# Patient Record
Sex: Female | Born: 1935 | Race: White | Hispanic: No | Marital: Married | Smoking: Never smoker
Health system: Southern US, Community
[De-identification: ages and names within clinical notes are randomized; demographics above are authoritative.]

## PROBLEM LIST (undated history)

## (undated) DIAGNOSIS — C439 Malignant melanoma of skin, unspecified: Secondary | ICD-10-CM

## (undated) DIAGNOSIS — K219 Gastro-esophageal reflux disease without esophagitis: Secondary | ICD-10-CM

## (undated) DIAGNOSIS — N189 Chronic kidney disease, unspecified: Secondary | ICD-10-CM

## (undated) DIAGNOSIS — M810 Age-related osteoporosis without current pathological fracture: Secondary | ICD-10-CM

## (undated) DIAGNOSIS — C50919 Malignant neoplasm of unspecified site of unspecified female breast: Secondary | ICD-10-CM

## (undated) DIAGNOSIS — M545 Low back pain, unspecified: Secondary | ICD-10-CM

## (undated) DIAGNOSIS — F0390 Unspecified dementia without behavioral disturbance: Secondary | ICD-10-CM

## (undated) DIAGNOSIS — M419 Scoliosis, unspecified: Secondary | ICD-10-CM

## (undated) DIAGNOSIS — R413 Other amnesia: Secondary | ICD-10-CM

## (undated) DIAGNOSIS — C801 Malignant (primary) neoplasm, unspecified: Secondary | ICD-10-CM

## (undated) DIAGNOSIS — H9190 Unspecified hearing loss, unspecified ear: Secondary | ICD-10-CM

## (undated) HISTORY — PX: BREAST SURGERY: SHX581

## (undated) HISTORY — PX: MELANOMA EXCISION: SHX5266

---

## 1988-01-16 HISTORY — PX: MASTECTOMY: SHX3

## 1998-08-16 ENCOUNTER — Other Ambulatory Visit: Admission: RE | Admit: 1998-08-16 | Discharge: 1998-08-16 | Payer: Self-pay | Admitting: *Deleted

## 2001-11-05 ENCOUNTER — Other Ambulatory Visit: Admission: RE | Admit: 2001-11-05 | Discharge: 2001-11-05 | Payer: Self-pay | Admitting: Geriatric Medicine

## 2002-01-16 ENCOUNTER — Ambulatory Visit (HOSPITAL_BASED_OUTPATIENT_CLINIC_OR_DEPARTMENT_OTHER): Admission: RE | Admit: 2002-01-16 | Discharge: 2002-01-16 | Payer: Self-pay | Admitting: General Surgery

## 2002-02-06 ENCOUNTER — Ambulatory Visit (HOSPITAL_COMMUNITY): Admission: RE | Admit: 2002-02-06 | Discharge: 2002-02-06 | Payer: Self-pay | Admitting: Gastroenterology

## 2003-11-10 ENCOUNTER — Other Ambulatory Visit: Admission: RE | Admit: 2003-11-10 | Discharge: 2003-11-10 | Payer: Self-pay | Admitting: Geriatric Medicine

## 2005-11-26 ENCOUNTER — Encounter: Admission: RE | Admit: 2005-11-26 | Discharge: 2005-11-26 | Payer: Self-pay | Admitting: Geriatric Medicine

## 2009-01-15 HISTORY — PX: CATARACT EXTRACTION: SUR2

## 2013-02-18 ENCOUNTER — Ambulatory Visit
Admission: RE | Admit: 2013-02-18 | Discharge: 2013-02-18 | Disposition: A | Discharge status: Home / Self Care | Payer: Medicare Other | Source: Ambulatory Visit | Attending: Geriatric Medicine | Admitting: Geriatric Medicine

## 2013-02-18 ENCOUNTER — Other Ambulatory Visit: Payer: Self-pay | Admitting: Geriatric Medicine

## 2013-02-18 DIAGNOSIS — M549 Dorsalgia, unspecified: Secondary | ICD-10-CM

## 2013-11-06 ENCOUNTER — Emergency Department (HOSPITAL_COMMUNITY)
Admission: EM | Admit: 2013-11-06 | Discharge: 2013-11-06 | Disposition: A | Discharge status: Home / Self Care | Payer: Medicare Other | Attending: Emergency Medicine | Admitting: Emergency Medicine

## 2013-11-06 ENCOUNTER — Encounter (HOSPITAL_COMMUNITY): Payer: Self-pay | Admitting: Emergency Medicine

## 2013-11-06 ENCOUNTER — Emergency Department (HOSPITAL_COMMUNITY): Payer: Medicare Other

## 2013-11-06 DIAGNOSIS — R4182 Altered mental status, unspecified: Secondary | ICD-10-CM | POA: Diagnosis not present

## 2013-11-06 DIAGNOSIS — Z79899 Other long term (current) drug therapy: Secondary | ICD-10-CM | POA: Insufficient documentation

## 2013-11-06 DIAGNOSIS — S0101XA Laceration without foreign body of scalp, initial encounter: Secondary | ICD-10-CM | POA: Insufficient documentation

## 2013-11-06 DIAGNOSIS — Z853 Personal history of malignant neoplasm of breast: Secondary | ICD-10-CM | POA: Insufficient documentation

## 2013-11-06 DIAGNOSIS — Y9289 Other specified places as the place of occurrence of the external cause: Secondary | ICD-10-CM | POA: Diagnosis not present

## 2013-11-06 DIAGNOSIS — Y9389 Activity, other specified: Secondary | ICD-10-CM | POA: Insufficient documentation

## 2013-11-06 DIAGNOSIS — W19XXXA Unspecified fall, initial encounter: Secondary | ICD-10-CM

## 2013-11-06 DIAGNOSIS — W1831XA Fall on same level due to stepping on an object, initial encounter: Secondary | ICD-10-CM | POA: Diagnosis not present

## 2013-11-06 DIAGNOSIS — S0990XA Unspecified injury of head, initial encounter: Secondary | ICD-10-CM

## 2013-11-06 HISTORY — DX: Malignant neoplasm of unspecified site of unspecified female breast: C50.919

## 2013-11-06 HISTORY — DX: Malignant melanoma of skin, unspecified: C43.9

## 2013-11-06 HISTORY — DX: Malignant (primary) neoplasm, unspecified: C80.1

## 2013-11-06 NOTE — ED Notes (Signed)
Pt here from [facility]. Daughter with her and states she was putting a bottle of wine away last night and fell off a step stool. Has a laceration to the back of her head. Bleeding controlled. Daughter states she fell another time this week against her night stand.

## 2013-11-06 NOTE — Discharge Instructions (Signed)
SEEK IMMEDIATE MEDICAL ATTENTION IF: There is redness, swelling, increasing pain in the wound, or a red line that goes up your arm or leg.  Pus is coming from wound.  An unexplained temperature above 100.4 develops.  You notice a foul smell coming from the wound from beneath the Dermabond.  There is a breaking open of the wound (edged not staying together) and the Dermabond breaks open.  You have had a head injury which does not appear to require admission at this time. A concussion is a state of changed mental ability from trauma. SEEK IMMEDIATE MEDICAL ATTENTION IF: There is confusion or drowsiness (although children frequently become drowsy after injury).  You cannot awaken the injured person.  There is nausea (feeling sick to your stomach) or continued, forceful vomiting.  You notice dizziness or unsteadiness which is getting worse, or inability to walk.  You have convulsions or unconsciousness.  You experience severe, persistent headaches not relieved by Tylenol?. (Do not take aspirin as this impairs clotting abilities). Take other pain medications only as directed.  You cannot use arms or legs normally.  There are changes in pupil sizes. (This is the black center in the colored part of the eye)  There is clear or bloody discharge from the nose or ears.  Change in speech, vision, swallowing, or understanding.  Localized weakness, numbness, tingling, or change in bowel or bladder control.

## 2013-11-06 NOTE — ED Notes (Signed)
Dermabond at bedside.  

## 2013-11-06 NOTE — ED Notes (Signed)
Pt from [facility] for eval of fall last night while putting a wine bottle, pt states that she slipped and fell off a step stool. Denies any LOC, pt denies taking any blood thinners. Pt alert and oriented, no neuro deficits noted. Pt denies any pain at this time.

## 2013-11-06 NOTE — ED Provider Notes (Addendum)
CSN: [id number]     Arrival date & time 11/06/13  1259 History   First MD Initiated Contact with Patient 11/06/13 1348     Chief Complaint  Patient presents with  . Fall  . Head Laceration     (Consider location/radiation/quality/duration/timing/severity/associated sxs/prior Treatment) HPI 80 year old female with poor memory at baseline and lives at independent living has partial amnesia for the event but recalls trying to put a bottle of wine away last night and apparently was balancing on a step stool and has poor balance at baseline and fell off the stool hitting the back of her head causing a laceration to the back of her head with patient's tetanus shot up-to-date within the last couple years with no headache no neck pain no back pain no chest pain no palpitations no shortness of breath no abdominal pain no change in speech or vision or swallowing or understanding no lateralizing weakness or numbness or incoordination and patient was able to walk at baseline today with her walker prior to arrival. Patient is not on anticoagulation. Past Medical History  Diagnosis Date  . Cancer   . Breast CA   . Melanoma    Past Surgical History  Procedure Laterality Date  . Mastectomy Left   . Breast surgery      reconstructive   No family history on file. History  Substance Use Topics  . Smoking status: Never Smoker   . Smokeless tobacco: Not on file  . Alcohol Use: Not on file   OB History    No data available     Review of Systems 10 Systems reviewed and are negative for acute change except as noted in the HPI.   Allergies  Sulfa antibiotics  Home Medications   Prior to Admission medications   Medication Sig Start Date End Date Taking? Authorizing Provider  pantoprazole (PROTONIX) 40 MG tablet Take 40 mg by mouth daily.   Yes Historical Provider, MD   BP 158/67 mmHg  Pulse 99  Temp(Src) 97.5 F (36.4 C) (Oral)  Resp 21  Ht 4\' 11"  (1.499 m)  Wt 102 lb (46.267 kg)  BMI  20.59 kg/m2  SpO2 98% Physical Exam  Nursing note and vitals reviewed. Constitutional:  Awake, alert, nontoxic appearance with baseline speech for patient.  HENT:  Mouth/Throat: No oropharyngeal exudate.  Irregular occipital scalp laceration 4 cm no foreign body noted no significant deep structure involvement noted  Eyes: EOM are normal. Pupils are equal, round, and reactive to light. Right eye exhibits no discharge. Left eye exhibits no discharge.  Neck: Neck supple.  Cervical spine and back without midline tenderness or pain  Cardiovascular: Normal rate and regular rhythm.   No murmur heard. Pulmonary/Chest: Effort normal and breath sounds normal. No stridor. No respiratory distress. She has no wheezes. She has no rales. She exhibits no tenderness.  Abdominal: Soft. Bowel sounds are normal. She exhibits no mass. There is no tenderness. There is no rebound.  Musculoskeletal: She exhibits no tenderness.  Baseline ROM, moves extremities with no obvious new focal weakness.  Lymphadenopathy:    She has no cervical adenopathy.  Neurological: She is alert.  Awake, alert, cooperative and aware of situation; motor strength bilaterally; sensation normal to light touch bilaterally; peripheral visual fields full to confrontation; no facial asymmetry; tongue midline; major cranial nerves appear intact; no pronator drift, normal finger to nose bilaterally  Skin: No rash noted.  Psychiatric: She has a normal mood and affect.    ED Course  Procedures (including critical care time) LACERATION REPAIR Performed by: Babette Relic Consent: Verbal consent obtained. Risks and benefits: risks, benefits and alternatives were discussed Patient identity confirmed: provided demographic data Time out performed prior to procedure Prepped and Draped in normal sterile fashion Wound explored Laceration Location: occipital scalp Laceration Length: 4cm No Foreign Bodies seen or palpated Irrigation method:  flush water and Safclens Amount of cleaning: standard Skin closure: Dermabond Technique: Hair twist with tissue glue Patient tolerance: Patient tolerated the procedure well with no immediate complications. Labs Review Labs Reviewed - No data to display  Imaging Review No results found. Ct Head Wo Contrast  11/06/2013   CLINICAL DATA:  [place] last night.  EXAM: CT HEAD WITHOUT CONTRAST  TECHNIQUE: Contiguous axial images were obtained from the base of the skull through the vertex without intravenous contrast.  COMPARISON:  None.  FINDINGS: There is are not, symmetric hypodensity in the periventricular and subcortical white matter bilaterally consistent with chronic microvascular ischemic change. Age related cerebral volume loss with commensurate mild prominence of the cerebral ventricles. No definite hydrocephalus.  Negative for intra or extra-axial hemorrhage, mass effect, mass lesion, or evidence of acute cortically based infarction.  The skull is intact. Small polyp or mucus retention cyst seen on the inferior-most image of the left maxillary sinus, and incompletely imaged. No air-fluid levels in the visualized sinuses. Mastoid air cells are clear.  Few small lobules of subcutaneous gas noted in the posterior midline upper scalp on image number 58. No definite scalp hematoma. No radiopaque foreign body.  IMPRESSION: 1. Subcutaneous locules of gas in the posterior midline scalp suggest small scalp injury. No scalp hematoma or skull fracture. 2. No acute intracranial abnormality. 3. Advanced chronic microvascular ischemic changes.   Electronically Signed   By: Curlene Dolphin M.D.   On: 11/06/2013 16:19    EKG Interpretation None      MDM   Final diagnoses:  Head injury, acute, initial encounter  Laceration of scalp  Fall    Patient / Family / Caregiver informed of clinical course, understand medical decision-making process, and agree with plan.  I doubt any other EMC precluding discharge at  this time including, but not necessarily limited to the following:ICH, CSI.    Babette Relic, MD 11/15/13 1546  Addendum: CT head indicated because Pt over 32yo with fall with head injury and partial amnesia for event with short term memory deficit. Pt meets [place]US, [place], and [place] criteria for ordering head CT.  Babette Relic, MD 12/10/13 [phone]

## 2014-01-19 ENCOUNTER — Observation Stay (HOSPITAL_COMMUNITY)
Admission: EM | Admit: 2014-01-19 | Discharge: 2014-01-20 | Disposition: A | Discharge status: Home / Self Care | Payer: Medicare Other | Attending: Internal Medicine | Admitting: Internal Medicine

## 2014-01-19 ENCOUNTER — Emergency Department (HOSPITAL_COMMUNITY): Payer: Medicare Other

## 2014-01-19 ENCOUNTER — Encounter (HOSPITAL_COMMUNITY): Payer: Self-pay | Admitting: Family Medicine

## 2014-01-19 ENCOUNTER — Observation Stay (HOSPITAL_COMMUNITY): Payer: Medicare Other

## 2014-01-19 DIAGNOSIS — R531 Weakness: Secondary | ICD-10-CM | POA: Diagnosis present

## 2014-01-19 DIAGNOSIS — Z7982 Long term (current) use of aspirin: Secondary | ICD-10-CM | POA: Diagnosis not present

## 2014-01-19 DIAGNOSIS — M545 Low back pain: Secondary | ICD-10-CM | POA: Diagnosis not present

## 2014-01-19 DIAGNOSIS — Z853 Personal history of malignant neoplasm of breast: Secondary | ICD-10-CM | POA: Diagnosis not present

## 2014-01-19 DIAGNOSIS — M810 Age-related osteoporosis without current pathological fracture: Secondary | ICD-10-CM | POA: Insufficient documentation

## 2014-01-19 DIAGNOSIS — N189 Chronic kidney disease, unspecified: Secondary | ICD-10-CM | POA: Insufficient documentation

## 2014-01-19 DIAGNOSIS — I517 Cardiomegaly: Secondary | ICD-10-CM

## 2014-01-19 DIAGNOSIS — K219 Gastro-esophageal reflux disease without esophagitis: Secondary | ICD-10-CM | POA: Diagnosis not present

## 2014-01-19 DIAGNOSIS — R42 Dizziness and giddiness: Secondary | ICD-10-CM

## 2014-01-19 DIAGNOSIS — I639 Cerebral infarction, unspecified: Secondary | ICD-10-CM

## 2014-01-19 DIAGNOSIS — E43 Unspecified severe protein-calorie malnutrition: Secondary | ICD-10-CM | POA: Diagnosis present

## 2014-01-19 DIAGNOSIS — G588 Other specified mononeuropathies: Secondary | ICD-10-CM | POA: Diagnosis not present

## 2014-01-19 DIAGNOSIS — G459 Transient cerebral ischemic attack, unspecified: Principal | ICD-10-CM | POA: Diagnosis present

## 2014-01-19 DIAGNOSIS — F0390 Unspecified dementia without behavioral disturbance: Secondary | ICD-10-CM | POA: Insufficient documentation

## 2014-01-19 DIAGNOSIS — M419 Scoliosis, unspecified: Secondary | ICD-10-CM | POA: Diagnosis not present

## 2014-01-19 DIAGNOSIS — I6529 Occlusion and stenosis of unspecified carotid artery: Secondary | ICD-10-CM | POA: Diagnosis not present

## 2014-01-19 DIAGNOSIS — W19XXXA Unspecified fall, initial encounter: Secondary | ICD-10-CM

## 2014-01-19 DIAGNOSIS — Z85820 Personal history of malignant melanoma of skin: Secondary | ICD-10-CM | POA: Insufficient documentation

## 2014-01-19 DIAGNOSIS — G458 Other transient cerebral ischemic attacks and related syndromes: Secondary | ICD-10-CM

## 2014-01-19 HISTORY — DX: Chronic kidney disease, unspecified: N18.9

## 2014-01-19 HISTORY — DX: Gastro-esophageal reflux disease without esophagitis: K21.9

## 2014-01-19 HISTORY — DX: Low back pain, unspecified: M54.50

## 2014-01-19 HISTORY — DX: Other amnesia: R41.3

## 2014-01-19 HISTORY — DX: Scoliosis, unspecified: M41.9

## 2014-01-19 HISTORY — DX: Age-related osteoporosis without current pathological fracture: M81.0

## 2014-01-19 HISTORY — DX: Low back pain: M54.5

## 2014-01-19 LAB — COMPREHENSIVE METABOLIC PANEL
ALK PHOS: 99 U/L (ref 39–117)
ALT: 35 U/L (ref 0–35)
AST: 45 U/L — AB (ref 0–37)
Albumin: 3.7 g/dL (ref 3.5–5.2)
Anion gap: 8 (ref 5–15)
BUN: 22 mg/dL (ref 6–23)
CO2: 26 mmol/L (ref 19–32)
Calcium: 9.3 mg/dL (ref 8.4–10.5)
Chloride: 101 mEq/L (ref 96–112)
Creatinine, Ser: 1.22 mg/dL — ABNORMAL HIGH (ref 0.50–1.10)
GFR calc non Af Amer: 39 mL/min — ABNORMAL LOW (ref >90)
GFR, EST AFRICAN AMERICAN: 45 mL/min — AB (ref >90)
GLUCOSE: 173 mg/dL — AB (ref 70–99)
Potassium: 3.6 mmol/L (ref 3.5–5.1)
Sodium: 135 mmol/L (ref 135–145)
TOTAL PROTEIN: 7 g/dL (ref 6.0–8.3)
Total Bilirubin: 0.6 mg/dL (ref 0.3–1.2)

## 2014-01-19 LAB — CBC
HEMATOCRIT: 38.5 % (ref 36.0–46.0)
Hemoglobin: 12.6 g/dL (ref 12.0–15.0)
MCH: 29.8 pg (ref 26.0–34.0)
MCHC: 32.7 g/dL (ref 30.0–36.0)
MCV: 91 fL (ref 78.0–100.0)
PLATELETS: 364 10*3/uL (ref 150–400)
RBC: 4.23 MIL/uL (ref 3.87–5.11)
RDW: 13.1 % (ref 11.5–15.5)
WBC: 13.4 10*3/uL — ABNORMAL HIGH (ref 4.0–10.5)

## 2014-01-19 LAB — POCT I-STAT, CHEM 8
BUN: 22 mg/dL (ref 6–23)
CALCIUM ION: 1.22 mmol/L (ref 1.13–1.30)
CREATININE: 1.3 mg/dL — AB (ref 0.50–1.10)
Chloride: 100 mEq/L (ref 96–112)
GLUCOSE: 176 mg/dL — AB (ref 70–99)
HEMATOCRIT: 40 % (ref 36.0–46.0)
Hemoglobin: 13.6 g/dL (ref 12.0–15.0)
Potassium: 3.8 mmol/L (ref 3.5–5.1)
Sodium: 137 mmol/L (ref 135–145)
TCO2: 23 mmol/L (ref 0–100)

## 2014-01-19 LAB — DIFFERENTIAL
Basophils Absolute: 0 10*3/uL (ref 0.0–0.1)
Basophils Relative: 0 % (ref 0–1)
EOS ABS: 0 10*3/uL (ref 0.0–0.7)
Eosinophils Relative: 0 % (ref 0–5)
LYMPHS ABS: 0.6 10*3/uL — AB (ref 0.7–4.0)
LYMPHS PCT: 5 % — AB (ref 12–46)
Monocytes Absolute: 1 10*3/uL (ref 0.1–1.0)
Monocytes Relative: 7 % (ref 3–12)
Neutro Abs: 11.8 10*3/uL — ABNORMAL HIGH (ref 1.7–7.7)
Neutrophils Relative %: 88 % — ABNORMAL HIGH (ref 43–77)

## 2014-01-19 LAB — HEMOGLOBIN A1C
Hgb A1c MFr Bld: 5.8 % — ABNORMAL HIGH (ref <5.7)
MEAN PLASMA GLUCOSE: 120 mg/dL — AB (ref <117)

## 2014-01-19 LAB — LIPID PANEL
CHOL/HDL RATIO: 4 ratio
Cholesterol: 223 mg/dL — ABNORMAL HIGH (ref 0–200)
HDL: 56 mg/dL (ref >39)
LDL CALC: 153 mg/dL — AB (ref 0–99)
TRIGLYCERIDES: 72 mg/dL (ref <150)
VLDL: 14 mg/dL (ref 0–40)

## 2014-01-19 LAB — ETHANOL

## 2014-01-19 LAB — PROTIME-INR
INR: 1.1 (ref 0.00–1.49)
PROTHROMBIN TIME: 14.4 s (ref 11.6–15.2)

## 2014-01-19 LAB — APTT: aPTT: 35 seconds (ref 24–37)

## 2014-01-19 LAB — POCT I-STAT TROPONIN I: Troponin i, poc: 0 ng/mL (ref 0.00–0.08)

## 2014-01-19 MED ORDER — ENOXAPARIN SODIUM 30 MG/0.3ML ~~LOC~~ SOLN
30.0000 mg | SUBCUTANEOUS | Status: DC
  Administered 2014-01-19 – 2014-01-20 (×2): 30 mg via SUBCUTANEOUS
  Filled 2014-01-19 (×4): qty 0.3

## 2014-01-19 MED ORDER — SENNOSIDES-DOCUSATE SODIUM 8.6-50 MG PO TABS: 1.0000 | ORAL_TABLET | Freq: Every evening | ORAL | Status: DC | PRN

## 2014-01-19 MED ORDER — PANTOPRAZOLE SODIUM 40 MG PO TBEC
40.0000 mg | DELAYED_RELEASE_TABLET | Freq: Every day | ORAL | Status: DC
  Administered 2014-01-19 – 2014-01-20 (×2): 40 mg via ORAL
  Filled 2014-01-19 (×2): qty 1

## 2014-01-19 MED ORDER — ASPIRIN EC 81 MG PO TBEC
81.0000 mg | DELAYED_RELEASE_TABLET | Freq: Every day | ORAL | Status: DC
  Administered 2014-01-19 – 2014-01-20 (×2): 81 mg via ORAL
  Filled 2014-01-19 (×2): qty 1

## 2014-01-19 MED ORDER — STROKE: EARLY STAGES OF RECOVERY BOOK
Freq: Once | Status: DC
Start: 2014-01-19 — End: 2014-01-19
  Filled 2014-01-19 (×2): qty 1

## 2014-01-19 NOTE — Progress Notes (Signed)
PT Cancellation Note  Patient Details Name: KATERI BALCH MRN: [id number] DOB: 03-12-1924   Cancelled Treatment:    Reason Eval/Treat Not Completed: Other (comment).  Orders to start 1/6,  Will check back tomorrow.    Denice Bors 01/19/2014, 12:46 PM

## 2014-01-19 NOTE — Progress Notes (Signed)
*  PRELIMINARY RESULTS* Vascular Ultrasound Carotid Duplex (Doppler) has been completed.  Findings suggest 1-39% internal carotid artery stenosis bilaterally. Vertebral arteries are patent with antegrade flow.  01/19/2014 2:11 PM Maudry Mayhew, RVT, RDCS, RDMS

## 2014-01-19 NOTE — ED Notes (Signed)
Pt in CT.

## 2014-01-19 NOTE — Evaluation (Signed)
{  EVAL NOTESLP Cancellation Note  Patient Details Name: Tanya Leblanc MRN: [id number] DOB: [birth year]   Cancelled treatment:       Reason Eval/Treat Not Completed:  (order for SLE to start 1/6 received, pt passed RNSSS)   Macario Golds 01/19/2014, 11:07 AM   Luanna Salk, [facility] SLP [phone]

## 2014-01-19 NOTE — H&P (Addendum)
Hospitalist Admission History and Physical  Patient name: Tanya Leblanc Medical record number: [id number] Date of birth: 03-08-1924 Age: 81 y.o. Gender: female  Primary Care Provider: Mathews Argyle, MD  Chief Complaint: ? TIA, fall, dizziness   History of Present Illness:This is a 81 y.o. year old female with no significant past medical history presenting with ? TIA, fall. Pt resident of local ALF. Pt was reported to have fallen while in closet. Pt was down for approx 45 mins. Pt was laying on her R shoulder/arm. Pt was helped up w/ noted R arm weakness. No reported LOC or head trauma. Pt does report feeling dizzy around time of fall.  T 97.9, HR 90s, resp 10s, BP 150s-170s, satting 97% on RA. WBC 13.4, hgb 12.6, Cr 1.22. Glu 173. CXR WNL. Head CT shows chronic ischemic changes. Pt/daughter deny any prior hx/o CVA in the past. Urinalysis pending.   Assessment and Plan: Tanya Leblanc is a 81 y.o. year old female presenting with weakness, TIA.   Active Problems:   Weakness   TIA (transient ischemic attack)   1- TIA  -asymptomatic on exam  -will proceed down TIA pathway including MRI, MRA, 2D ECHO, risk stratification labs  -tele bed -neuro c/s in am  -hold ASA pending imaging   FEN/GI: heart healthy diet pending bedside swallow eval  Prophylaxis: lovenox  Disposition: pending further evaluation  Code Status:Full Code    Patient Active Problem List   Diagnosis Date Noted  . Weakness 01/19/2014  . TIA (transient ischemic attack) 01/19/2014   Past Medical History: Past Medical History  Diagnosis Date  . Scoliosis   . Low back pain   . Chronic kidney disease   . Amnesia   . Cancer   . Breast CA   . Melanoma   . GERD (gastroesophageal reflux disease)   . Osteoporosis     Past Surgical History: Past Surgical History  Procedure Laterality Date  . Mastectomy Left   . Breast surgery      reconstructive  . Cataract extraction Bilateral      Social History: History   Social History  . Marital Status: Married    Spouse Name: N/A    Number of Children: N/A  . Years of Education: N/A   Social History Main Topics  . Smoking status: Never Smoker   . Smokeless tobacco: None  . Alcohol Use: Yes     Comment: 0.5 Glass a day  . Drug Use: No  . Sexual Activity: None   Other Topics Concern  . None   Social History Narrative    Family History: History reviewed. No pertinent family history.  Allergies: Allergies  Allergen Reactions  . Actonel [Risedronate] Other (See Comments)    Per patients mar  . Boniva [Ibandronic Acid] Other (See Comments)    Per patiens mar  . Fosamax [Alendronate] Other (See Comments)    Per patiens mar  . Miacalcin [Calcitonin] Other (See Comments)    Per patients mar  . Sulfa Antibiotics Itching    Current Facility-Administered Medications  Medication Dose Route Frequency Provider Last Rate Last Dose  .  stroke: mapping our early stages of recovery book   Does not apply Once Shanda Howells, MD      . enoxaparin (LOVENOX) injection 30 mg  30 mg Subcutaneous Q24H Shanda Howells, MD      . pantoprazole (PROTONIX) EC tablet 40 mg  40 mg Oral Daily Shanda Howells, MD      .  senna-docusate (Senokot-S) tablet 1 tablet  1 tablet Oral QHS PRN Shanda Howells, MD       Current Outpatient Prescriptions  Medication Sig Dispense Refill  . acetaminophen (TYLENOL) 500 MG tablet Take 500 mg by mouth every 6 (six) hours as needed for mild pain.    . pantoprazole (PROTONIX) 40 MG tablet Take 40 mg by mouth daily.    . ranitidine (ZANTAC) 150 MG tablet Take 150 mg by mouth daily as needed for heartburn.     Review Of Systems: 12 point ROS negative except as noted above in HPI.  Physical Exam: Filed Vitals:   01/19/14 0400  BP: 157/69  Pulse:   Temp:   Resp:     General: alert and cooperative HEENT: PERRLA and extra ocular movement intact Heart: S1, S2 normal, no murmur, rub or gallop, regular  rate and rhythm Lungs: clear to auscultation, no wheezes or rales and unlabored breathing Abdomen: abdomen is soft without significant tenderness, masses, organomegaly or guarding Extremities: extremities normal, atraumatic, no cyanosis or edema Skin:no rashes Neurology: normal without focal findings  Labs and Imaging: Lab Results  Component Value Date/Time   NA 135 01/19/2014 02:44 AM   K 3.6 01/19/2014 02:44 AM   CL 101 01/19/2014 02:44 AM   CO2 26 01/19/2014 02:44 AM   BUN 22 01/19/2014 02:44 AM   CREATININE 1.22* 01/19/2014 02:44 AM   GLUCOSE 173* 01/19/2014 02:44 AM   Lab Results  Component Value Date   WBC 13.4* 01/19/2014   HGB 12.6 01/19/2014   HCT 38.5 01/19/2014   MCV 91.0 01/19/2014   PLT 364 01/19/2014   Urinalysis No results found for: COLORURINE, APPEARANCEUR, LABSPEC, PHURINE, GLUCOSEU, HGBUR, BILIRUBINUR, KETONESUR, PROTEINUR, UROBILINOGEN, NITRITE, LEUKOCYTESUR     Dg Chest 2 View  01/19/2014   CLINICAL DATA:  Patient became dizzy this morning and fell. Right arm pain.  EXAM: CHEST  2 VIEW  COMPARISON:  None.  FINDINGS: Shallow inspiration. Cardiac enlargement. Normal pulmonary vascularity. Emphysematous changes in the upper lungs with interstitial fibrosis in the lung bases. No focal consolidation. No blunting of costophrenic angles. No pneumothorax. Surgical clips in the left axilla.  IMPRESSION: No active cardiopulmonary disease.   Electronically Signed   By: Lucienne Capers M.D.   On: 01/19/2014 02:46   Ct Head Wo Contrast  01/19/2014   CLINICAL DATA:  Patient became dizzy and fell. Generalize weakness. Numbness to the right arm. History of melanoma and left breast cancer. Patient will not straighten her head.  EXAM: CT HEAD WITHOUT CONTRAST  TECHNIQUE: Contiguous axial images were obtained from the base of the skull through the vertex without intravenous contrast.  COMPARISON:  11/06/2013  FINDINGS: Examination is limited due to patient positioning. Diffuse  cerebral atrophy. Ventricular dilatation likely due to central atrophy. Low-attenuation throughout the deep and periventricular white matter consistent with small vessel ischemia. No mass effect or midline shift. No abnormal extra-axial fluid collections. Gray-white matter junctions are distinct. Basal cisterns are not effaced. No evidence of acute intracranial hemorrhage. No depressed skull fractures. Small retention cysts in the maxillary antra. Mastoid air cells are not opacified. Vascular calcifications.  IMPRESSION: No acute intracranial abnormalities. Chronic atrophy and small vessel ischemic changes.   Electronically Signed   By: Lucienne Capers M.D.   On: 01/19/2014 02:42           Shanda Howells MD  Pager: [phone]

## 2014-01-19 NOTE — Progress Notes (Signed)
  Echocardiogram 2D Echocardiogram has been performed.  Donata Clay 01/19/2014, 12:34 PM

## 2014-01-19 NOTE — ED Notes (Signed)
Attempted an in and out cath. Received urine in the tube but not enough for a sample. Will inform Dr. Claudine Mouton. Vallarie Mare, NT assisted. Pt has been urinating cause pull up with soaked. Performed patient hygiene on patient. Changed pull up.

## 2014-01-19 NOTE — ED Notes (Signed)
Per EMS, patient fell around 23:00 on to a carpeted surface from being dizzy. Denies any LOC or hitting her head on anything. Had right arm numbness for about 1 hr and now it is gone. Pt stated she laid on the arm for about 45 min - 1 hr before someone saw her. Denies pain except generalized weakness.

## 2014-01-19 NOTE — Progress Notes (Signed)
[facility] History and Physical  Tanya Leblanc QZR:[id number] DOB: 11-12-1924    PCP:   Mathews Argyle, MD   81 yo female admitted yesterday for possible TIA.  She was seen by neurology who made recommendation of TIA work up. Her MRI was negative.  Her doppler had only up to 39 percent stenosis.  She was given ASA 81mg  per day.  No further event.  Rewiew of Systems:  Constitutional: Negative for malaise, fever and chills. No significant weight loss or weight gain Eyes: Negative for eye pain, redness and discharge, diplopia, visual changes, or flashes of light. ENMT: Negative for ear pain, hoarseness, nasal congestion, sinus pressure and sore throat. No headaches; tinnitus, drooling, or problem swallowing. Cardiovascular: Negative for chest pain, palpitations, diaphoresis, dyspnea and peripheral edema. ; No orthopnea, PND Respiratory: Negative for cough, hemoptysis, wheezing and stridor. No pleuritic chestpain. Gastrointestinal: Negative for nausea, vomiting, diarrhea, constipation, abdominal pain, melena, blood in stool, hematemesis, jaundice and rectal bleeding.    Genitourinary: Negative for frequency, dysuria, incontinence,flank pain and hematuria; Musculoskeletal: Negative for back pain and neck pain. Negative for swelling and trauma.;  Skin: . Negative for pruritus, rash, abrasions, bruising and skin lesion.; ulcerations Neuro: Negative for headache, lightheadedness and neck stiffness. Negative for weakness, altered level of consciousness , altered mental status, extremity weakness, burning feet, involuntary movement, seizure and syncope.  Psych: negative for anxiety, depression, insomnia, tearfulness, panic attacks, hallucinations, paranoia, suicidal or homicidal ideation   Past Medical History  Diagnosis Date  . Scoliosis   . Low back pain   . Chronic kidney disease   . Amnesia   . Cancer   . Breast CA   . Melanoma   . GERD (gastroesophageal reflux disease)    . Osteoporosis     Past Surgical History  Procedure Laterality Date  . Mastectomy Left   . Breast surgery      reconstructive  . Cataract extraction Bilateral     Medications:  HOME MEDS: Prior to Admission medications   Medication Sig Start Date End Date Taking? Authorizing Provider  acetaminophen (TYLENOL) 500 MG tablet Take 500 mg by mouth every 6 (six) hours as needed for mild pain.   Yes Historical Provider, MD  pantoprazole (PROTONIX) 40 MG tablet Take 40 mg by mouth daily.   Yes Historical Provider, MD  ranitidine (ZANTAC) 150 MG tablet Take 150 mg by mouth daily as needed for heartburn.   Yes Historical Provider, MD     Allergies:  Allergies  Allergen Reactions  . Actonel [Risedronate] Other (See Comments)    Per patients mar  . Boniva [Ibandronic Acid] Other (See Comments)    Per patiens mar  . Fosamax [Alendronate] Other (See Comments)    Per patiens mar  . Miacalcin [Calcitonin] Other (See Comments)    Per patients mar  . Sulfa Antibiotics Itching    Social History:   reports that she has never smoked. She does not have any smokeless tobacco history on file. She reports that she drinks alcohol. She reports that she does not use illicit drugs.  Family History: History reviewed. No pertinent family history.   Physical Exam: Filed Vitals:   01/19/14 0400 01/19/14 0417 01/19/14 0510 01/19/14 1345  BP: 157/69 147/67 155/72 172/73  Pulse:  95 90 84  Temp:  98.3 F (36.8 C) 98.1 F (36.7 C) 97.8 F (36.6 C)  TempSrc:  Oral Axillary Oral  Resp:  20 20 20   Height:      Weight:  44.8 kg (98 lb 12.3 oz)   SpO2:  100% 100% 99%   Blood pressure 172/73, pulse 84, temperature 97.8 F (36.6 C), temperature source Oral, resp. rate 20, height 5\' 2"  (1.575 m), weight 44.8 kg (98 lb 12.3 oz), SpO2 99 %.  GEN:  Pleasant patient lying in the stretcher in no acute distress; She doesn't answer any question for me. PSYCH:  Confused.; does not appear anxious or  depressed; affect is appropriate. HEENT: Mucous membranes pink and anicteric; PERRLA; EOM intact; no cervical lymphadenopathy nor thyromegaly or carotid bruit; no JVD; There were no stridor. Neck is very supple. Breasts:: Not examined CHEST WALL: No tenderness CHEST: Normal respiration, clear to auscultation bilaterally.  HEART: Regular rate and rhythm.  There are no murmur, rub, or gallops.   BACK: No kyphosis or scoliosis; no CVA tenderness ABDOMEN: soft and non-tender; no masses, no organomegaly, normal abdominal bowel sounds; no pannus; no intertriginous candida. There is no rebound and no distention. Rectal Exam: Not done EXTREMITIES: No bone or joint deformity; age-appropriate arthropathy of the hands and knees; no edema; no ulcerations.  There is no calf tenderness. Genitalia: not examined PULSES: 2+ and symmetric SKIN: Normal hydration no rash or ulceration CNS: Cranial nerves 2-12 grossly intact no focal lateralizing neurologic deficit.  Speech is fluent; uvula elevated with phonation, facial symmetry and tongue midline. DTR are normal bilaterally, cerebella exam is intact, barbinski is negative and strengths are equaled bilaterally.  No sensory loss.   Labs on Admission:  Basic Metabolic Panel:  Recent Labs Lab 01/19/14 0244 01/19/14 0254  NA 135 137  K 3.6 3.8  CL 101 100  CO2 26  --   GLUCOSE 173* 176*  BUN 22 22  CREATININE 1.22* 1.30*  CALCIUM 9.3  --    Liver Function Tests:  Recent Labs Lab 01/19/14 0244  AST 45*  ALT 35  ALKPHOS 99  BILITOT 0.6  PROT 7.0  ALBUMIN 3.7   No results for input(s): LIPASE, AMYLASE in the last 168 hours. No results for input(s): AMMONIA in the last 168 hours. CBC:  Recent Labs Lab 01/19/14 0244 01/19/14 0254  WBC 13.4*  --   NEUTROABS 11.8*  --   HGB 12.6 13.6  HCT 38.5 40.0  MCV 91.0  --   PLT 364  --    Cardiac Enzymes: No results for input(s): CKTOTAL, CKMB, CKMBINDEX, TROPONINI in the last 168  hours.  CBG: No results for input(s): GLUCAP in the last 168 hours.   Radiological Exams on Admission: Dg Chest 2 View  01/19/2014   CLINICAL DATA:  Patient became dizzy this morning and fell. Right arm pain.  EXAM: CHEST  2 VIEW  COMPARISON:  None.  FINDINGS: Shallow inspiration. Cardiac enlargement. Normal pulmonary vascularity. Emphysematous changes in the upper lungs with interstitial fibrosis in the lung bases. No focal consolidation. No blunting of costophrenic angles. No pneumothorax. Surgical clips in the left axilla.  IMPRESSION: No active cardiopulmonary disease.   Electronically Signed   By: Lucienne Capers M.D.   On: 01/19/2014 02:46   Ct Head Wo Contrast  01/19/2014   CLINICAL DATA:  Patient became dizzy and fell. Generalize weakness. Numbness to the right arm. History of melanoma and left breast cancer. Patient will not straighten her head.  EXAM: CT HEAD WITHOUT CONTRAST  TECHNIQUE: Contiguous axial images were obtained from the base of the skull through the vertex without intravenous contrast.  COMPARISON:  11/06/2013  FINDINGS: Examination is limited due to  patient positioning. Diffuse cerebral atrophy. Ventricular dilatation likely due to central atrophy. Low-attenuation throughout the deep and periventricular white matter consistent with small vessel ischemia. No mass effect or midline shift. No abnormal extra-axial fluid collections. Gray-white matter junctions are distinct. Basal cisterns are not effaced. No evidence of acute intracranial hemorrhage. No depressed skull fractures. Small retention cysts in the maxillary antra. Mastoid air cells are not opacified. Vascular calcifications.  IMPRESSION: No acute intracranial abnormalities. Chronic atrophy and small vessel ischemic changes.   Electronically Signed   By: Lucienne Capers M.D.   On: 01/19/2014 02:42   Mr Brain Wo Contrast  01/19/2014   CLINICAL DATA:  81 year old female with history of breast cancer and melanoma presenting  with dizziness after fall. Generalized weakness and right arm numbness. Subsequent encounter.  EXAM: MRI HEAD WITHOUT CONTRAST  MRA HEAD WITHOUT CONTRAST  TECHNIQUE: Multiplanar, multiecho pulse sequences of the brain and surrounding structures were obtained without intravenous contrast. Angiographic images of the head were obtained using MRA technique without contrast.  COMPARISON:  01/19/2014 head CT.  No comparison brain MR.  FINDINGS: MRI HEAD FINDINGS  No acute infarct.  Significant small vessel disease type changes with confluent white matter type changes subcortical/ periventricular region.  Atrophy. Ventricular prominence probably related to atrophy although difficult to completely exclude a mild component of hydrocephalus.  No intracranial hemorrhage.  No intracranial mass lesion noted on this unenhanced exam.  Major intracranial vascular structures are patent.  Cervical spondylotic changes with spinal stenosis and cord flattening C3-4 and C4-5 level. Transverse ligament hypertrophy. If further delineation is clinically desired, MR of the cervical spine may be considered.  Minimal to mild mucosal thickening inferior maxillary sinuses and minimal mucosal thickening ethmoid sinus air cells.  Cervical medullary junction, pituitary region, pineal region and orbital structures unremarkable.  MRA HEAD FINDINGS  Mild irregularity and narrowing cavernous segment internal carotid artery bilaterally.  Mild to moderate narrowing M1 segment right middle cerebral artery.  Early branching left middle cerebral artery.  Mild middle cerebral artery branch vessel irregularity bilaterally.  Mild narrowing and irregularity A1 segment and A2 segment anterior cerebral artery bilaterally.  No significant stenosis of the distal vertebral arteries.  Moderate narrowing of portions of the posterior inferior cerebellar artery bilaterally.  Mild narrowing mid aspect of the basilar artery more notable right lateral aspect.   Nonvisualized anterior inferior cerebellar artery bilaterally.  Mild to moderate narrowing superior cerebral artery more notable on the left.  Moderate to marked tandem stenosis posterior cerebral artery greater on the right.  No aneurysm noted.  IMPRESSION: MRI HEAD  No acute infarct.  Significant small vessel disease type changes with confluent white matter type changes subcortical/ periventricular region.  Atrophy. Ventricular prominence probably related to atrophy although difficult to completely exclude a mild component of hydrocephalus.  No intracranial hemorrhage.  No intracranial mass lesion noted on this unenhanced exam.  Cervical spondylotic changes with spinal stenosis and cord flattening C3-4 and C4-5 level. If further delineation is clinically desired, MR of the cervical spine may be considered.  MRA HEAD  Prominent intracranial atherosclerotic type changes as detailed above.   Electronically Signed   By: Chauncey Cruel M.D.   On: 01/19/2014 10:20   Mr Jodene Nam Head/brain Wo Cm  01/19/2014   CLINICAL DATA:  81 year old female with history of breast cancer and melanoma presenting with dizziness after fall. Generalized weakness and right arm numbness. Subsequent encounter.  EXAM: MRI HEAD WITHOUT CONTRAST  MRA HEAD WITHOUT CONTRAST  TECHNIQUE: Multiplanar, multiecho pulse sequences of the brain and surrounding structures were obtained without intravenous contrast. Angiographic images of the head were obtained using MRA technique without contrast.  COMPARISON:  01/19/2014 head CT.  No comparison brain MR.  FINDINGS: MRI HEAD FINDINGS  No acute infarct.  Significant small vessel disease type changes with confluent white matter type changes subcortical/ periventricular region.  Atrophy. Ventricular prominence probably related to atrophy although difficult to completely exclude a mild component of hydrocephalus.  No intracranial hemorrhage.  No intracranial mass lesion noted on this unenhanced exam.  Major  intracranial vascular structures are patent.  Cervical spondylotic changes with spinal stenosis and cord flattening C3-4 and C4-5 level. Transverse ligament hypertrophy. If further delineation is clinically desired, MR of the cervical spine may be considered.  Minimal to mild mucosal thickening inferior maxillary sinuses and minimal mucosal thickening ethmoid sinus air cells.  Cervical medullary junction, pituitary region, pineal region and orbital structures unremarkable.  MRA HEAD FINDINGS  Mild irregularity and narrowing cavernous segment internal carotid artery bilaterally.  Mild to moderate narrowing M1 segment right middle cerebral artery.  Early branching left middle cerebral artery.  Mild middle cerebral artery branch vessel irregularity bilaterally.  Mild narrowing and irregularity A1 segment and A2 segment anterior cerebral artery bilaterally.  No significant stenosis of the distal vertebral arteries.  Moderate narrowing of portions of the posterior inferior cerebellar artery bilaterally.  Mild narrowing mid aspect of the basilar artery more notable right lateral aspect.  Nonvisualized anterior inferior cerebellar artery bilaterally.  Mild to moderate narrowing superior cerebral artery more notable on the left.  Moderate to marked tandem stenosis posterior cerebral artery greater on the right.  No aneurysm noted.  IMPRESSION: MRI HEAD  No acute infarct.  Significant small vessel disease type changes with confluent white matter type changes subcortical/ periventricular region.  Atrophy. Ventricular prominence probably related to atrophy although difficult to completely exclude a mild component of hydrocephalus.  No intracranial hemorrhage.  No intracranial mass lesion noted on this unenhanced exam.  Cervical spondylotic changes with spinal stenosis and cord flattening C3-4 and C4-5 level. If further delineation is clinically desired, MR of the cervical spine may be considered.  MRA HEAD  Prominent  intracranial atherosclerotic type changes as detailed above.   Electronically Signed   By: Chauncey Cruel M.D.   On: 01/19/2014 10:20   Assessment/Plan Present on Admission:  . TIA (transient ischemic attack)   PLAN: Will continue with TIA work up.  Physical therapy to be started tomorrow.  Continue with ASA.  Check Homocysteine level and Lipid.  Neuro is following.  Other plans as per orders.  Code Status: FULL Haskel Khan, MD. [facility] Pager [phone] 7pm to 7am.  01/19/2014, 2:22 PM

## 2014-01-19 NOTE — Consult Note (Signed)
Neurology Consultation Reason for Consult: Right arm weakness and numbness Referring Physician: Susanne Borders  CC: right arm weakness and numbness  History is obtained from:Patient  HPI: Tanya Leblanc is a 81 y.o. female who was in her normal state yesterday when she became "dizzy" by which she means lightheaded. She fell and was unable to get up. She was helped up and it was noted that she had right arm weakness. All of her symptoms resolved over the course of 45 minutes.   ROS: A 14 point ROS was performed and is negative except as noted in the HPI.   Past Medical History  Diagnosis Date  . Scoliosis   . Low back pain   . Chronic kidney disease   . Amnesia   . Cancer   . Breast CA   . Melanoma   . GERD (gastroesophageal reflux disease)   . Osteoporosis     Family History: No hx similar  Social History: Tob: denies  Exam: Current vital signs: BP 155/72 mmHg  Pulse 90  Temp(Src) 98.1 F (36.7 C) (Axillary)  Resp 20  Ht 5\' 2"  (1.575 m)  Wt 44.8 kg (98 lb 12.3 oz)  BMI 18.06 kg/m2  SpO2 100% Vital signs in last 24 hours: Temp:  [97.9 F (36.6 C)-98.3 F (36.8 C)] 98.1 F (36.7 C) (01/05 0510) Pulse Rate:  [90-95] 90 (01/05 0510) Resp:  [17-21] 20 (01/05 0510) BP: (147-170)/(67-76) 155/72 mmHg (01/05 0510) SpO2:  [94 %-100 %] 100 % (01/05 0510) Weight:  [44.8 kg (98 lb 12.3 oz)-45.36 kg (100 lb)] 44.8 kg (98 lb 12.3 oz) (01/05 0510)   Physical Exam  Constitutional: Appears well-developed and well-nourished.  Psych: Affect appropriate to situation Eyes: No scleral injection HENT: No OP obstrucion Head: Normocephalic.  Cardiovascular: Normal rate and regular rhythm.  Respiratory: Effort normal  GI: Soft.  No distension. There is no tenderness.  Skin: WDI  Neuro: Mental Status: Patient is awake, alert, oriented to person, place, She initially gives year as 2013, but self corrects to 2016.  Patient is able to give a clear and coherent history. No signs of  aphasia or neglect Cranial Nerves: II: Visual Fields are full. Pupils are equal, round, and reactive to light.   III,IV, VI: EOMI without ptosis or diploplia.  V: Facial sensation is symmetric to temperature VII: Facial movement is symmetric.  VIII: hard of hearing X: Uvula elevates symmetrically XI: Shoulder shrug is symmetric. XII: tongue is midline without atrophy or fasciculations.  Motor: Tone is normal. Bulk is normal. 5/5 strength was present in all four extremities.  Sensory: Sensation is symmetric to light touch  in the arms and legs. Deep Tendon Reflexes: 2+ and symmetric in the biceps and patellae.  Cerebellar: FNF intact bilaterally    I have reviewed labs in epic and the results pertinent to this consultation are: LDL 153 CMP - mildly elevated creatinine  I have reviewed the images obtained:MRI brain - severe white matter changes consistent with small vessel disease, this includes brainstem. MRA with atherosclerosis.   Impression: 81 yo F with transient dizziness and right arm weakness. Though presyncope with subsequent compressive neuropathy(e.g. Her arm fell asleep because she was laying on it) is difficult to exclude I would conisder TIA as a possibility given her extensive atherosclerosis and white matter changes. I feel it would be reasonable to treat it as such at this time.   I feel that it is less likely to be related to her C-spine disease  given the transience of the symptoms.   Recommendations: 1) ASA 81mg  2) LDL goal < 70, currently 150, would favor starting statin.  3) a1c pending, would treat DM if present.  4) dopplers, echo pending.  5) telemetry 6) will continue to follow.    Roland Rack, MD [facility] [phone]  If 7pm- 7am, please page neurology on call as listed in [place].

## 2014-01-19 NOTE — Progress Notes (Signed)
UR completed 

## 2014-01-19 NOTE — ED Notes (Signed)
Bed: WA23 Expected date:  Expected time:  Means of arrival:  Comments: EMS 

## 2014-01-19 NOTE — Progress Notes (Signed)
OT Cancellation Note  Patient Details Name: Tanya Leblanc MRN: [id number] DOB: 01-02-1925   Cancelled Treatment:    Reason Eval/Treat Not Completed: Other (comment) Will check on pt next date. Thank you.  Jules Schick  [phone] 01/19/2014, 12:41 PM

## 2014-01-19 NOTE — ED Provider Notes (Signed)
CSN: [id number]     Arrival date & time 01/19/14  0122 History   First MD Initiated Contact with Patient 01/19/14 0129     Chief Complaint  Patient presents with  . Fall     (Consider location/radiation/quality/duration/timing/severity/associated sxs/prior Treatment) HPI Tanya Leblanc is a 81 y.o. female with past medical history of chronic kidney disease, breast cancer, osteoporosis presenting today for a fall. She comes from a nursing facility, she states she was hanging out up her clothes and became dizzy. She subsequently fell and hit her head. She denies any LOC. Patient had right upper extremity numbness and weakness for 45 minutes, however she was also laying on that arm before someone found her. Currently in the room she denies dizziness or numbness anywhere. Patient denies any pain. Per the daughter patient may have history of TIA and they're unaware of any residual deficits. Patient denies feeling ill recently, she denies fevers or recent infections. Patient has no further complants.  10 Systems reviewed and are negative for acute change except as noted in the HPI.    Past Medical History  Diagnosis Date  . Scoliosis   . Low back pain   . Chronic kidney disease   . Amnesia   . Cancer   . Breast CA   . Melanoma   . GERD (gastroesophageal reflux disease)   . Osteoporosis    Past Surgical History  Procedure Laterality Date  . Mastectomy Left   . Breast surgery      reconstructive  . Cataract extraction Bilateral    History reviewed. No pertinent family history. History  Substance Use Topics  . Smoking status: Never Smoker   . Smokeless tobacco: Not on file  . Alcohol Use: Yes     Comment: 0.5 Glass a day   OB History    No data available     Review of Systems    Allergies  Sulfa antibiotics  Home Medications   Prior to Admission medications   Medication Sig Start Date End Date Taking? Authorizing Provider  pantoprazole (PROTONIX) 40 MG tablet  Take 40 mg by mouth daily.    Historical Provider, MD   BP 170/72 mmHg  Pulse 92  Temp(Src) 97.9 F (36.6 C) (Oral)  Resp 17  Ht 5\' 2"  (1.575 m)  Wt 100 lb (45.36 kg)  BMI 18.29 kg/m2  SpO2 97% Physical Exam  Constitutional: She is oriented to person, place, and time. She appears well-developed and well-nourished. No distress.  HENT:  Head: Normocephalic and atraumatic.  Nose: Nose normal.  Mouth/Throat: Oropharynx is clear and moist. No oropharyngeal exudate.  Eyes: Conjunctivae and EOM are normal. Pupils are equal, round, and reactive to light. No scleral icterus.  Neck: Normal range of motion. Neck supple. No JVD present. No tracheal deviation present. No thyromegaly present.  Cardiovascular: Normal rate, regular rhythm and normal heart sounds.  Exam reveals no gallop and no friction rub.   No murmur heard. Pulmonary/Chest: Effort normal and breath sounds normal. No respiratory distress. She has no wheezes. She exhibits no tenderness.  Abdominal: Soft. Bowel sounds are normal. She exhibits no distension and no mass. There is no tenderness. There is no rebound and no guarding.  Musculoskeletal: Normal range of motion. She exhibits no edema or tenderness.  Lymphadenopathy:    She has no cervical adenopathy.  Neurological: She is alert and oriented to person, place, and time. No cranial nerve deficit. She exhibits normal muscle tone.  4-5 strength on the right  side, 5 out of 5 on the left side. No sensory deficits. Normal cerebellar testing. Gait was not assessed.  Skin: Skin is warm and dry. No rash noted. She is not diaphoretic. No erythema. No pallor.  Nursing note and vitals reviewed.   ED Course  Procedures (including critical care time) Labs Review Labs Reviewed  CBC - Abnormal; Notable for the following:    WBC 13.4 (*)    All other components within normal limits  DIFFERENTIAL - Abnormal; Notable for the following:    Neutrophils Relative % 88 (*)    Neutro Abs 11.8  (*)    Lymphocytes Relative 5 (*)    Lymphs Abs 0.6 (*)    All other components within normal limits  COMPREHENSIVE METABOLIC PANEL - Abnormal; Notable for the following:    Glucose, Bld 173 (*)    Creatinine, Ser 1.22 (*)    AST 45 (*)    GFR calc non Af Amer 39 (*)    GFR calc Af Amer 45 (*)    All other components within normal limits  ETHANOL  PROTIME-INR  APTT  URINE RAPID DRUG SCREEN (HOSP PERFORMED)  URINALYSIS, ROUTINE W REFLEX MICROSCOPIC  I-STAT CHEM 8, ED  I-STAT TROPOININ, ED  Randolm Idol, ED    Imaging Review Dg Chest 2 View  01/19/2014   CLINICAL DATA:  Patient became dizzy this morning and fell. Right arm pain.  EXAM: CHEST  2 VIEW  COMPARISON:  None.  FINDINGS: Shallow inspiration. Cardiac enlargement. Normal pulmonary vascularity. Emphysematous changes in the upper lungs with interstitial fibrosis in the lung bases. No focal consolidation. No blunting of costophrenic angles. No pneumothorax. Surgical clips in the left axilla.  IMPRESSION: No active cardiopulmonary disease.   Electronically Signed   By: Lucienne Capers M.D.   On: 01/19/2014 02:46   Ct Head Wo Contrast  01/19/2014   CLINICAL DATA:  Patient became dizzy and fell. Generalize weakness. Numbness to the right arm. History of melanoma and left breast cancer. Patient will not straighten her head.  EXAM: CT HEAD WITHOUT CONTRAST  TECHNIQUE: Contiguous axial images were obtained from the base of the skull through the vertex without intravenous contrast.  COMPARISON:  11/06/2013  FINDINGS: Examination is limited due to patient positioning. Diffuse cerebral atrophy. Ventricular dilatation likely due to central atrophy. Low-attenuation throughout the deep and periventricular white matter consistent with small vessel ischemia. No mass effect or midline shift. No abnormal extra-axial fluid collections. Gray-white matter junctions are distinct. Basal cisterns are not effaced. No evidence of acute intracranial  hemorrhage. No depressed skull fractures. Small retention cysts in the maxillary antra. Mastoid air cells are not opacified. Vascular calcifications.  IMPRESSION: No acute intracranial abnormalities. Chronic atrophy and small vessel ischemic changes.   Electronically Signed   By: Lucienne Capers M.D.   On: 01/19/2014 02:42     EKG Interpretation   Date/Time:  Tuesday January 19 2014 02:18:43 EST Ventricular Rate:  95 PR Interval:  202 QRS Duration: 79 QT Interval:  357 QTC Calculation: 449 R Axis:   22 Text Interpretation:  Sinus rhythm Artifact Confirmed by Glynn Octave [phone]) on 01/19/2014 2:49:27 AM      MDM   Final diagnoses:  None    Patient presents to the ED for dizziness and subsequent fall.  She also had 57min of numbness of the right arm, but also was laying on that arm until someone found her.  Because her symptoms began 4 hours ago, I  merely consult and neurology who recommends for the patient to be admitted to the hospitalist service here at [facility] and be transferred to [facility] in the morning for stroke workup. She is currently out of the window for any intervention.   CT of head does not reveal any hemorrhagic stroke. Laboratory workup was negative, urinalysis is still pending. We'll treat if there is an infection. Patient was admitted to [facility], telemetry unit.   Everlene Balls, MD 01/19/14 [phone]

## 2014-01-19 NOTE — Progress Notes (Signed)
INITIAL NUTRITION ASSESSMENT  DOCUMENTATION CODES Per approved criteria  -Severe malnutrition in the context of chronic illness -Underweight  Pt meets criteria for severe MALNUTRITION in the context of chronic illness as evidenced by PO intake < 75% for > one month, severe muscle wasting and moderate fat loss.  INTERVENTION: -Recommend Regular liberalized diet with SLP recommended diet textures -Recommend Ensure Complete po BID, each supplement provides 350 kcal and 13 grams of protein w/diet advancement -RD to continue to monitor  NUTRITION DIAGNOSIS: Inadequate oral intake related to inability to eat  as evidenced by NPO status.   Goal: Pt to meet >/= 90% of their estimated nutrition needs    Monitor:  Diet order, swallow profile, total protein/energy intake, labs, weights  Reason for Assessment: MST/Underweight  81 y.o. female  Admitting Dx: <principal problem not specified>  ASSESSMENT: 81 y.o. year old female with no significant past medical history presenting with ? TIA, fall. Pt resident of local ALF. Pt was reported to have fallen while in closet. Pt was down for approx 45 mins. Pt was laying on her R shoulder/arm.  -Pt denied any recent change in appetite. Reported to consume two meals daily and with one Ensure Complete daily, but not regularly. -NPO pending SLP evaluation. Pt expressed feeling hungry and eager for meals. Reported to be tolerating regular diet textures -Denied any changes in weight pta; however pt did note her PCP has expressed concern with pt's weight. Previous medical records indicate a 4 lb weight loss in past 3 months (3.9% body weight loss, non-severe for time frame) -Nutrition Focused Physical Exam:  Subcutaneous Fat:  Orbital Region: WDL Upper Arm Region: Moderate wasting Thoracic and Lumbar Region: n/a  Muscle:  Temple Region: Moderate Clavicle Bone Region: severe Clavicle and Acromion Bone Region: severe Scapular Bone Region:  n/a Dorsal Hand: severe wasting Patellar Region: n/a Anterior Thigh Region: n/a Posterior Calf Region: n/a  Edema: none noted    Height: Ht Readings from Last 1 Encounters:  01/19/14 5\' 2"  (1.575 m)    Weight: Wt Readings from Last 1 Encounters:  01/19/14 98 lb 12.3 oz (44.8 kg)    Ideal Body Weight: 110 lb  % Ideal Body Weight: 112%  Wt Readings from Last 10 Encounters:  01/19/14 98 lb 12.3 oz (44.8 kg)  11/06/13 102 lb (46.267 kg)    Usual Body Weight: ~100 lb per med records  % Usual Body Weight: 96%  BMI:  Body mass index is 18.06 kg/(m^2). Underweight  Estimated Nutritional Needs: Kcal: 1350-1550 Protein: 55-65 gram Fluid: >/=1550 ml daily  Skin: WDL  Diet Order: Diet NPO time specified  EDUCATION NEEDS: -No education needs identified at this time  No intake or output data in the 24 hours ending 01/19/14 1054  Last BM: PTA  Labs:   Recent Labs Lab 01/19/14 0244  NA 135  K 3.6  CL 101  CO2 26  BUN 22  CREATININE 1.22*  CALCIUM 9.3  GLUCOSE 173*    CBG (last 3)  No results for input(s): GLUCAP in the last 72 hours.  Scheduled Meds: .  stroke: mapping our early stages of recovery book   Does not apply Once  . enoxaparin (LOVENOX) injection  30 mg Subcutaneous Q24H  . pantoprazole  40 mg Oral Daily    Continuous Infusions:   Past Medical History  Diagnosis Date  . Scoliosis   . Low back pain   . Chronic kidney disease   . Amnesia   . Cancer   .  Breast CA   . Melanoma   . GERD (gastroesophageal reflux disease)   . Osteoporosis     Past Surgical History  Procedure Laterality Date  . Mastectomy Left   . Breast surgery      reconstructive  . Cataract extraction Bilateral     Atlee Abide MS RD LDN Clinical Dietitian [phone]

## 2014-01-20 ENCOUNTER — Encounter (HOSPITAL_COMMUNITY): Payer: Self-pay | Admitting: Internal Medicine

## 2014-01-20 DIAGNOSIS — G589 Mononeuropathy, unspecified: Secondary | ICD-10-CM | POA: Insufficient documentation

## 2014-01-20 DIAGNOSIS — G459 Transient cerebral ischemic attack, unspecified: Secondary | ICD-10-CM | POA: Diagnosis not present

## 2014-01-20 DIAGNOSIS — E43 Unspecified severe protein-calorie malnutrition: Secondary | ICD-10-CM

## 2014-01-20 DIAGNOSIS — R531 Weakness: Secondary | ICD-10-CM

## 2014-01-20 LAB — URINALYSIS, ROUTINE W REFLEX MICROSCOPIC
Bilirubin Urine: NEGATIVE
Glucose, UA: 250 mg/dL — AB
Ketones, ur: NEGATIVE mg/dL
Nitrite: NEGATIVE
PH: 6.5 (ref 5.0–8.0)
Protein, ur: 100 mg/dL — AB
SPECIFIC GRAVITY, URINE: 1.019 (ref 1.005–1.030)
UROBILINOGEN UA: 1 mg/dL (ref 0.0–1.0)

## 2014-01-20 LAB — URINE MICROSCOPIC-ADD ON

## 2014-01-20 LAB — RAPID URINE DRUG SCREEN, HOSP PERFORMED
Amphetamines: NOT DETECTED
Barbiturates: NOT DETECTED
Benzodiazepines: NOT DETECTED
Cocaine: NOT DETECTED
OPIATES: NOT DETECTED
TETRAHYDROCANNABINOL: NOT DETECTED

## 2014-01-20 MED ORDER — ASPIRIN 81 MG PO TBEC
81.0000 mg | DELAYED_RELEASE_TABLET | Freq: Every day | ORAL | Status: DC
Start: 2014-01-20 — End: 2014-10-25

## 2014-01-20 MED ORDER — ATORVASTATIN CALCIUM 10 MG PO TABS: 10.0000 mg | ORAL_TABLET | Freq: Every day | ORAL | Status: DC

## 2014-01-20 NOTE — Plan of Care (Signed)
Problem: Progression Outcomes Goal: Bowel & Bladder Continence Outcome: Not Met (add Reason) Patient incontinent at times at baseline  Problem: Discharge/Transitional Outcomes Goal: Independent mobility/functioning independent or with min Independent mobility/functioning independently or with minimal assistance  Outcome: Adequate for Discharge Going to rehab at d/c

## 2014-01-20 NOTE — Evaluation (Signed)
Physical Therapy Evaluation Patient Details Name: ROSALYN ARCHAMBAULT MRN: [id number] DOB: 1924-04-24 Today's Date: 01/20/2014   History of Present Illness  81 year old female with c/o transient dizziness and right arm weakness and admitted for TIA work up.  Clinical Impression  Pt admitted with above diagnosis. Pt currently with functional limitations due to the deficits listed below (see PT Problem List).  Pt will benefit from skilled PT to increase their independence and safety with mobility to allow discharge to the venue listed below.  Pt able to answer simple questions with time and follow simple commands.  Pt appears to be poor historian.  Will follow while in acute care and assist with mobilization.     Follow Up Recommendations SNF (unless ALF able to provide current assist level)    Equipment Recommendations  None recommended by PT    Recommendations for Other Services       Precautions / Restrictions Precautions Precautions: Fall Restrictions Weight Bearing Restrictions: No      Mobility  Bed Mobility Overal bed mobility: Needs Assistance Bed Mobility: Supine to Sit     Supine to sit: Max assist     General bed mobility comments: sitting EOB on arrival with RN, increased posterior trunk lean  Transfers Overall transfer level: Needs assistance Equipment used: 1 person hand held assist Transfers: Sit to/from [facility] Sit to Stand: Mod assist Stand pivot transfers: Mod assist       General transfer comment: verbal cues for safe technique and posture, provided 1 HHA and cues for other hand to hold armrest for more support  Ambulation/Gait                Stairs            Wheelchair Mobility    Modified Rankin (Stroke Patients Only)       Balance Overall balance assessment: Needs assistance Sitting-balance support: Feet supported;Bilateral upper extremity supported Sitting balance-Leahy Scale: Poor Sitting balance -  Comments: posterior lean observed in sitting EOB upon entering room, pt able to correct with cues however quickly returns to posterior lean, however upon standing increased forward trunk flexion  Postural control: Other (comment)                                   Pertinent Vitals/Pain Pain Assessment: No/denies pain    Home Living Family/patient expects to be discharged to:: Assisted living               Home Equipment: Walker - 2 wheels Additional Comments: pt had just moved from ILF to ALF    Prior Function Level of Independence: Needs assistance   Gait / Transfers Assistance Needed: pt reports she is ambulatory with RW            Hand Dominance        Extremity/Trunk Assessment   Upper Extremity Assessment: Generalized weakness           Lower Extremity Assessment: Generalized weakness      Cervical / Trunk Assessment: Kyphotic  Communication   Communication: HOH  Cognition Arousal/Alertness: Awake/alert Behavior During Therapy: Flat affect Overall Cognitive Status: No family/caregiver present to determine baseline cognitive functioning                      General Comments      Exercises        Assessment/Plan  PT Assessment Patient needs continued PT services  PT Diagnosis Difficulty walking;Generalized weakness   PT Problem List Decreased strength;Decreased balance;Decreased mobility;Decreased knowledge of use of DME  PT Treatment Interventions DME instruction;Gait training;Functional mobility training;Patient/family education;Therapeutic activities;Therapeutic exercise   PT Goals (Current goals can be found in the Care Plan section) Acute Rehab PT Goals Patient Stated Goal: did not state PT Goal Formulation: With patient Time For Goal Achievement: 01/27/14 Potential to Achieve Goals: Good    Frequency Min 3X/week   Barriers to discharge        Co-evaluation               End of Session   Activity  Tolerance: Patient tolerated treatment well Patient left: in chair;with call bell/phone within reach;with chair alarm set Nurse Communication: Mobility status    Functional Assessment Tool Used: clinical judgement Functional Limitation: Mobility: Walking and moving around Mobility: Walking and Moving Around Current Status (K9326): At least 40 percent but less than 60 percent impaired, limited or restricted Mobility: Walking and Moving Around Goal Status [phone]): At least 1 percent but less than 20 percent impaired, limited or restricted    Time: 1056-1108 PT Time Calculation (min) (ACUTE ONLY): 12 min   Charges:   PT Evaluation $Initial PT Evaluation Tier I: 1 Procedure PT Treatments $Therapeutic Activity: 8-22 mins   PT G Codes:   PT G-Codes **NOT FOR INPATIENT CLASS** Functional Assessment Tool Used: clinical judgement Functional Limitation: Mobility: Walking and moving around Mobility: Walking and Moving Around Current Status (Y0998): At least 40 percent but less than 60 percent impaired, limited or restricted Mobility: Walking and Moving Around Goal Status [phone]): At least 1 percent but less than 20 percent impaired, limited or restricted    Romilda Proby,KATHrine E 01/20/2014, 12:07 PM Carmelia Bake, PT, DPT 01/20/2014 Pager: [phone]

## 2014-01-20 NOTE — Progress Notes (Signed)
Subjective: No further events.   She complains of occasional dizziness, but other than this no recent problems with walking, no bowel or bladder issues.   Exam: Filed Vitals:   01/20/14 0414  BP: 181/74  Pulse: 93  Temp: 98.2 F (36.8 C)  Resp: 18   Gen: In bed, NAD MS: Awake, alert, interactvie and appropriate AY:TKZSW, face symmetric Motor: 5/5 throughout, normal tone.  Sensory:intact to LT DTR:2+ and symmetric, downgoing toes.    Impression: 81 yo F with transient dizziness and right arm weakness. Though presyncope with subsequent compressive neuropathy(e.g. Her arm fell asleep because she was laying on it) is difficult to exclude I would conisder TIA as a possibility given her extensive atherosclerosis and white matter changes. I feel it would be reasonable to treat it as such at this time.   She does not clearly have symptoms related to her C-spine disease(which still has CSF posterior so is unlikely to be symptomatic). If she were to develop progressive difficulty walking, weakness, stiffness of the legs, trouble with dropping things, etc. Then repeat C-spine imaging should be performed.   Recommendations: 1)asa 81mg  2) statin for elevated LDL 3) no further recommendations, please call with further questions or concerns.   Roland Rack, MD [facility] [phone]  If 7pm- 7am, please page neurology on call as listed in [place].

## 2014-01-20 NOTE — Progress Notes (Signed)
UR completed 

## 2014-01-20 NOTE — Evaluation (Signed)
Occupational Therapy Evaluation Patient Details Name: Tanya Leblanc MRN: [id number] DOB: May 02, 1924 Today's Date: 01/20/2014    History of Present Illness Tanya Leblanc is a 81 y.o. female who was in her normal state yesterday when she became "dizzy" by which she means lightheaded. She fell and was unable to get up. She was helped up and it was noted that she had right arm weakness. All of her symptoms resolved over the course of 45 minutes.  Pt with significant  scoliosis.    Clinical Impression   Pt admitted s/p fall. Pt currently with functional limitations due to the deficits listed below (see OT Problem List).  Pt will benefit from skilled OT to increase their safety and independence with ADL and functional mobility for ADL to facilitate discharge to venue listed below.     Follow Up Recommendations  SNF;Other (comment) (rehab at [place])    Equipment Recommendations  None recommended by OT    Recommendations for Other Services       Precautions / Restrictions Precautions Precautions: Fall Restrictions Weight Bearing Restrictions: No      Mobility Bed Mobility Overal bed mobility: Needs Assistance Bed Mobility: Supine to Sit     Supine to sit: Max assist        Transfers Overall transfer level: Needs assistance Equipment used: 1 person hand held assist Transfers: Sit to/from Stand Sit to Stand: Max assist              Balance Overall balance assessment: History of Falls;Needs assistance Sitting-balance support: Feet supported;Bilateral upper extremity supported Sitting balance-Leahy Scale: Poor Sitting balance - Comments:  (forward lean) Postural control: Other (comment)                                  ADL Overall ADL's : Needs assistance/impaired     Grooming: Wash/dry face;Sitting Grooming Details (indicate cue type and reason): pt leaning forward sitting EOB and leaning down to hands to wash face. Pt unable to sit  upright- pt with significant scolosis- but bends forward  in sitting and needs A to sit upright                 Toilet Transfer: Maximal assistance;BSC   Toileting- Clothing Manipulation and Hygiene: Total assistance;Sit to/from stand               Vision                     Perception     Praxis      Pertinent Vitals/Pain Pain Assessment: No/denies pain     Hand Dominance     Extremity/Trunk Assessment Upper Extremity Assessment Upper Extremity Assessment: Generalized weakness           Communication Communication Communication: HOH   Cognition Arousal/Alertness: Lethargic Behavior During Therapy: Flat affect Overall Cognitive Status: No family/caregiver present to determine baseline cognitive functioning                     General Comments       Exercises       Shoulder Instructions      Home Living Family/patient expects to be discharged to:: Assisted living                             Home Equipment: Walker - 2 wheels   Additional  Comments: pt had just moved from ILF to ALF      Prior Functioning/Environment Level of Independence: Needs assistance             OT Diagnosis: Generalized weakness   OT Problem List: Decreased strength;Decreased activity tolerance   OT Treatment/Interventions: Self-care/ADL training;Patient/family education;DME and/or AE instruction;Balance training;Neuromuscular education;Therapeutic activities    OT Goals(Current goals can be found in the care plan section) Acute Rehab OT Goals Patient Stated Goal: did not state OT Goal Formulation: Patient unable to participate in goal setting Time For Goal Achievement: 02/03/14 Potential to Achieve Goals: Good  OT Frequency:     Barriers to D/C:            Co-evaluation              End of Session Nurse Communication: Mobility status  Activity Tolerance: Patient limited by fatigue Patient left: in bed;with call  bell/phone within reach;with nursing/sitter in room   Time: 1008-1031 OT Time Calculation (min): 23 min Charges:  OT General Charges $OT Visit: 1 Procedure OT Evaluation $Initial OT Evaluation Tier I: 1 Procedure OT Treatments $Self Care/Home Management : 23-37 mins G-Codes: OT G-codes **NOT FOR INPATIENT CLASS** Functional Assessment Tool Used: clinical observation Functional Limitation: Self care Self Care Current Status (R1021): At least 80 percent but less than 100 percent impaired, limited or restricted Self Care Goal Status (R1735): At least 20 percent but less than 40 percent impaired, limited or restricted  [place], Thereasa Parkin 01/20/2014, 10:46 AM

## 2014-01-20 NOTE — Progress Notes (Signed)
Patient is set to discharge back to [facility] SNF today. Patient & daughter, Abigail Butts aware. Discharge packet given to RN, Estill Bamberg. Daughter to transport to SNF.    Raynaldo Opitz, [facility] Clinical Social Worker cell #: [phone]

## 2014-01-20 NOTE — Discharge Summary (Signed)
[facility] PROGRESS NOTE  Tanya Leblanc XNT:[id number] DOB: 15-May-1924    PCP:   Mathews Argyle, MD   HPI: Tanya Leblanc is an 81 y.o. female admitted by Dr Ernestina Patches on 01/19/14 for right arm weakness after a fall. According to his H and P;  "   History of Present Illness:This is a 81 y.o. year old female with no significant past medical history presenting with ? TIA, fall. Pt resident of local ALF. Pt was reported to have fallen while in closet. Pt was down for approx 45 mins. Pt was laying on her R shoulder/arm. Pt was helped up w/ noted R arm weakness. No reported LOC or head trauma. Pt does report feeling dizzy around time of fall.  T 97.9, HR 90s, resp 10s, BP 150s-170s, satting 97% on RA. WBC 13.4, hgb 12.6, Cr 1.22. Glu 173. CXR WNL. Head CT shows chronic ischemic changes. Pt/daughter deny any prior hx/o CVA in the past. Urinalysis pending.    HOSPITAL COURSE:  Patient was admitted into the hospital.  She is improving with her right arm. She had a TIA work up to include a doppler of her carotids, which showed nonocclussive stenosis (1-39%), and MRI showed no acute CVA.  Her MRA showed extensive athrerosclerosis and mild to moderate stenosis, but no critical focal narrowing.  She was seen in consultation with neurologist Dr Tomi Bamberger, who felt that she may have had focal conpression of her nerve, since she was lying on her arm.  He recommended ASA 81mg  and statin, which she will be given Lipitor 10mg  per day.  She will be following up with her PCP regarding further evalaution of her dizziness.  She is anxious to go home, and will be discharge to assisted living.  I updated her daughter Tanya Leblanc about her condition.  She is in agreement with her mother being discharged today.   DISCHARGE MEDICATIONS:   Prior to Admission medications   Medication Sig Start Date End Date Taking? Authorizing Provider  acetaminophen (TYLENOL) 500 MG tablet Take 500 mg by mouth every 6  (six) hours as needed for mild pain.   Yes Historical Provider, MD  pantoprazole (PROTONIX) 40 MG tablet Take 40 mg by mouth daily.   Yes Historical Provider, MD         aspirin EC 81 MG EC tablet Take 1 tablet (81 mg total) by mouth daily. 01/20/14   Orvan Falconer, MD  atorvastatin (LIPITOR) 10 MG tablet Take 1 tablet (10 mg total) by mouth daily. 01/20/14   Orvan Falconer, MD     DISCHARGE DIAGNOSIS:  . TIA (transient ischemic attack) . Protein-calorie malnutrition, severe Dizziness Dementia Peripheral Nerve compression.   Code Status: FULL Haskel Khan, MD. [facility] Pager [phone] 7pm to 7am.  01/20/2014, 2:16 PM

## 2014-01-22 LAB — HOMOCYSTEINE: Homocysteine: 11 umol/L (ref 0.0–15.0)

## 2014-02-22 ENCOUNTER — Encounter: Payer: Self-pay | Admitting: Adult Health

## 2014-02-22 ENCOUNTER — Non-Acute Institutional Stay (SKILLED_NURSING_FACILITY): Payer: Medicare Other | Admitting: Adult Health

## 2014-02-22 DIAGNOSIS — R8290 Unspecified abnormal findings in urine: Secondary | ICD-10-CM

## 2014-02-22 DIAGNOSIS — E785 Hyperlipidemia, unspecified: Secondary | ICD-10-CM

## 2014-02-22 LAB — LIPID PANEL
CHOLESTEROL: 186 mg/dL (ref 0–200)
HDL: 57 mg/dL (ref 35–70)
LDL Cholesterol: 112 mg/dL
TRIGLYCERIDES: 86 mg/dL (ref 40–160)

## 2014-02-22 LAB — BASIC METABOLIC PANEL
BUN: 28 mg/dL — AB (ref 4–21)
CREATININE: 1.1 mg/dL (ref 0.5–1.1)
Glucose: 117 mg/dL
Potassium: 4.2 mmol/L (ref 3.4–5.3)
Sodium: 134 mmol/L — AB (ref 137–147)

## 2014-02-22 LAB — CBC AND DIFFERENTIAL
HEMATOCRIT: 34 % — AB (ref 36–46)
HEMOGLOBIN: 11.1 g/dL — AB (ref 12.0–16.0)
Platelets: 385 10*3/uL (ref 150–399)
WBC: 8.7 10^3/mL

## 2014-02-22 NOTE — Progress Notes (Signed)
Patient ID: Tanya Leblanc, female   DOB: 1924-06-18, 81 y.o.   MRN: [id number]    Nursing Home Location:  [place] of Service: SNF (31)  PCP: Mathews Argyle, MD  Allergies  Allergen Reactions  . Actonel [Risedronate] Other (See Comments)    Per patients mar  . Boniva [Ibandronic Acid] Other (See Comments)    Per patiens mar  . Fosamax [Alendronate] Other (See Comments)    Per patiens mar  . Miacalcin [Calcitonin] Other (See Comments)    Per patients mar  . Sulfa Antibiotics Itching    Chief Complaint  Patient presents with  . Acute Visit    frequency, foul smelling urine    HPI:  Patient is a 81 y.o. female seen today at [facility] in the rehab section. She was admitted to rehab after a fall and subsequent hospitalization (01/19/14-01/20/14).  She was found to have a TIA and nerve compression to the right arm. She was started on a statin and aspirin therapy. The staff reported that she had a foul smelling urine last night with frequency. The day shift has not noted this. She has not had pain, fever, or other symptoms. She is working with PT and back to her baseline.   Review of Systems:  Review of Systems  Constitutional: Negative for fever, chills, diaphoresis, activity change, appetite change and fatigue.  Respiratory: Negative for cough, shortness of breath and wheezing.   Cardiovascular: Positive for leg swelling. Negative for chest pain and palpitations.  Gastrointestinal: Negative for abdominal pain, constipation and abdominal distention.  Genitourinary: Positive for frequency. Negative for dysuria, hematuria, flank pain, difficulty urinating and pelvic pain.  Neurological: Negative for dizziness and facial asymmetry.    Past Medical History  Diagnosis Date  . Scoliosis   . Low back pain   . Chronic kidney disease   . Amnesia   . Cancer   . Breast CA   . Melanoma   . GERD (gastroesophageal reflux  disease)   . Osteoporosis    Past Surgical History  Procedure Laterality Date  . Mastectomy Left   . Breast surgery      reconstructive  . Cataract extraction Bilateral    Social History:   reports that she has never smoked. She does not have any smokeless tobacco history on file. She reports that she drinks alcohol. She reports that she does not use illicit drugs.  History reviewed. No pertinent family history.  Medications: Patient's Medications  New Prescriptions   No medications on file  Previous Medications   ASPIRIN EC 81 MG EC TABLET    Take 1 tablet (81 mg total) by mouth daily.   ATORVASTATIN (LIPITOR) 10 MG TABLET    Take 1 tablet (10 mg total) by mouth daily.   PANTOPRAZOLE (PROTONIX) 40 MG TABLET    Take 40 mg by mouth daily.  Modified Medications   No medications on file  Discontinued Medications   No medications on file     Physical Exam: Filed Vitals:   02/22/14 1204  BP: 137/66  Pulse: 76  Temp: 97.3 F (36.3 C)  Resp: 18  Weight: 106 lb 6.4 oz (48.263 kg)  SpO2: 96%    Physical Exam  Constitutional: No distress.  frail  Cardiovascular: Normal rate and regular rhythm.   No murmur heard. Pedal edema bilat  Pulmonary/Chest: Effort normal and breath sounds normal. No respiratory distress. She has no wheezes.  Abdominal: Soft. Bowel sounds are normal.  She exhibits no distension. There is no tenderness.  No flank tenderness  Musculoskeletal:  MAE.  Severe scoliosis.  Neurological: She is alert.  Oriented x 2.  Skin: Skin is warm and dry. She is not diaphoretic.  Psychiatric: She has a normal mood and affect.    Labs reviewed: Basic Metabolic Panel:  Recent Labs  01/19/14 0244 01/19/14 0254  NA 135 137  K 3.6 3.8  CL 101 100  CO2 26  --   GLUCOSE 173* 176*  BUN 22 22  CREATININE 1.22* 1.30*  CALCIUM 9.3  --    Liver Function Tests:  Recent Labs  01/19/14 0244  AST 45*  ALT 35  ALKPHOS 99  BILITOT 0.6  PROT 7.0  ALBUMIN 3.7    No results for input(s): LIPASE, AMYLASE in the last 8760 hours. No results for input(s): AMMONIA in the last 8760 hours. CBC:  Recent Labs  01/19/14 0244 01/19/14 0254  WBC 13.4*  --   NEUTROABS 11.8*  --   HGB 12.6 13.6  HCT 38.5 40.0  MCV 91.0  --   PLT 364  --    TSH: No results for input(s): TSH in the last 8760 hours. A1C: Lab Results  Component Value Date   HGBA1C 5.8* 01/19/2014   Lipid Panel:  Recent Labs  01/19/14 0244  CHOL 223*  HDL 56  LDLCALC 153*  TRIG 72  CHOLHDL 4.0    Assessment/Plan 1. Foul smelling urine Urine obtained already otherwise asymptomatic. Conflicting reports from staff. She has a hx of asymptomatic bacteruria. Will await cx.  Check CBC to eval WBCs.   2. Hyperlipidemia Check lipids and liver fxn due to recent addition of statin to her meds.   [name], ANP [facility] [phone]

## 2014-02-23 ENCOUNTER — Encounter: Payer: Self-pay | Admitting: Internal Medicine

## 2014-02-23 ENCOUNTER — Non-Acute Institutional Stay (SKILLED_NURSING_FACILITY): Payer: Medicare Other | Admitting: Internal Medicine

## 2014-02-23 DIAGNOSIS — M810 Age-related osteoporosis without current pathological fracture: Secondary | ICD-10-CM | POA: Diagnosis not present

## 2014-02-23 DIAGNOSIS — E43 Unspecified severe protein-calorie malnutrition: Secondary | ICD-10-CM

## 2014-02-23 DIAGNOSIS — K219 Gastro-esophageal reflux disease without esophagitis: Secondary | ICD-10-CM

## 2014-02-23 DIAGNOSIS — H9190 Unspecified hearing loss, unspecified ear: Secondary | ICD-10-CM | POA: Insufficient documentation

## 2014-02-23 DIAGNOSIS — G589 Mononeuropathy, unspecified: Secondary | ICD-10-CM | POA: Diagnosis not present

## 2014-02-23 DIAGNOSIS — R531 Weakness: Secondary | ICD-10-CM | POA: Diagnosis not present

## 2014-02-23 DIAGNOSIS — H9193 Unspecified hearing loss, bilateral: Secondary | ICD-10-CM | POA: Diagnosis not present

## 2014-02-23 DIAGNOSIS — E785 Hyperlipidemia, unspecified: Secondary | ICD-10-CM

## 2014-02-23 MED ORDER — ATORVASTATIN CALCIUM 20 MG PO TABS: 20.0000 mg | ORAL_TABLET | Freq: Every day | ORAL | Status: DC

## 2014-02-23 MED ORDER — ACETAMINOPHEN 500 MG PO TABS: 500.0000 mg | ORAL_TABLET | Freq: Four times a day (QID) | ORAL | Status: DC | PRN

## 2014-02-23 NOTE — Progress Notes (Signed)
Patient ID: Tanya Leblanc, female   DOB: May 31, 1924, 81 y.o.   MRN: [id number]  Provider:  Rexene Edison. Mariea Clonts, D.O., C.M.D.  Location:  [facility]  PCP: Mathews Argyle, MD  Code Status: DNR, does have living will and hcpoa on file in [facility] chart  Allergies  Allergen Reactions  . Actonel [Risedronate] Other (See Comments)    Per patients mar  . Boniva [Ibandronic Acid] Other (See Comments)    Per patiens mar  . Fosamax [Alendronate] Other (See Comments)    Per patiens mar  . Miacalcin [Calcitonin] Other (See Comments)    Per patients mar  . Sulfa Antibiotics Itching    Chief Complaint  Patient presents with  . New Admit To SNF    s/p hospitalization with fall on right arm with peripheral nerve compression    HPI: 81 y.o. female with h/o prior breast cancer, GERD, senile osteoporosis, low back pain, chronic kidney disease, melanoma was admitted here to rehab s/p hospitalization with fall in her closet of her AL room.  She apparently was down for 45 minutes before she was found at which point she had weakness of her right arm.  She had a full stroke workup which was unremarkable.  She was started on a statin and aspirin therapy due to concerns that she may have had a TIA though neurology felt she had nerve compression.  She also had some mild leukocytosis on admission with a negative urinalysis.    She has been participating in PT, OT.  She has made improvements.  She was seen yesterday for an acute visit due to urinary frequency, but it seems that this was inconsistent shift to shift.  F/u labs were ordered as she had not had any since admission.    When seen, she was resting in bed.  She had no complaints whatsoever.  She denies concerns about urinary frequency, urgency, dysuria and has been afebrile.  Her urine culture did grow out >100K ecoli, but she is likely colonized at this point.  Staff request medication due to constipation--senna s.  ROS: Review  of Systems  Constitutional: Positive for malaise/fatigue. Negative for fever.  HENT: Negative for congestion.   Respiratory: Negative for shortness of breath.   Cardiovascular: Negative for chest pain and leg swelling.  Gastrointestinal: Positive for constipation. Negative for abdominal pain.  Genitourinary: Negative for dysuria, urgency and frequency.  Musculoskeletal: Positive for back pain. Negative for myalgias and falls.  Skin: Negative for rash.  Neurological: Negative for sensory change and focal weakness.  Psychiatric/Behavioral: Negative for depression.     Past Medical History  Diagnosis Date  . Scoliosis   . Low back pain   . Chronic kidney disease   . Amnesia   . Cancer   . Breast CA   . Melanoma   . GERD (gastroesophageal reflux disease)   . Osteoporosis    Past Surgical History  Procedure Laterality Date  . Mastectomy Left   . Breast surgery      reconstructive  . Cataract extraction Bilateral    Social History:   reports that she has never smoked. She does not have any smokeless tobacco history on file. She reports that she drinks alcohol. She reports that she does not use illicit drugs.  No family history on file.  Medications: Patient's Medications  New Prescriptions   No medications on file  Previous Medications   ASPIRIN EC 81 MG EC TABLET    Take 1 tablet (  81 mg total) by mouth daily.   ATORVASTATIN (LIPITOR) 10 MG TABLET    Take 1 tablet (10 mg total) by mouth daily.   PANTOPRAZOLE (PROTONIX) 40 MG TABLET    Take 40 mg by mouth daily.  Modified Medications   No medications on file  Discontinued Medications   No medications on file     Physical Exam: Filed Vitals:   02/23/14 1014  BP: 137/66  Pulse: 76  Temp: 97.3 F (36.3 C)  Resp: 18  Height: 5' (1.524 m)  Weight: 106 lb 6.4 oz (48.263 kg)  SpO2: 96%  Physical Exam  Constitutional: No distress.  HENT:  Head: Normocephalic and atraumatic.  Eyes: EOM are normal. Pupils are equal,  round, and reactive to light.  Neck: Normal range of motion. Neck supple.  Cardiovascular: Normal rate, regular rhythm, normal heart sounds and intact distal pulses.   Pulmonary/Chest: Effort normal and breath sounds normal. No respiratory distress.  Abdominal: Soft. Bowel sounds are normal. She exhibits no distension and no mass. There is no tenderness.  Musculoskeletal: Normal range of motion. She exhibits no edema or tenderness.  Neurological: She is alert.  Skin: Skin is warm and dry.  Psychiatric: She has a normal mood and affect.     Labs reviewed: Basic Metabolic Panel:  Recent Labs  01/19/14 0244 01/19/14 0254 02/22/14  NA 135 137 134*  K 3.6 3.8 4.2  CL 101 100  --   CO2 26  --   --   GLUCOSE 173* 176*  --   BUN 22 22 28*  CREATININE 1.22* 1.30* 1.1  CALCIUM 9.3  --   --    Liver Function Tests:  Recent Labs  01/19/14 0244  AST 45*  ALT 35  ALKPHOS 99  BILITOT 0.6  PROT 7.0  ALBUMIN 3.7   No results for input(s): LIPASE, AMYLASE in the last 8760 hours. No results for input(s): AMMONIA in the last 8760 hours. CBC:  Recent Labs  01/19/14 0244 01/19/14 0254 02/22/14  WBC 13.4*  --  8.7  NEUTROABS 11.8*  --   --   HGB 12.6 13.6 11.1*  HCT 38.5 40.0 34*  MCV 91.0  --   --   PLT 364  --  385   Lab Results  Component Value Date   CHOL 186 02/22/2014   HDL 57 02/22/2014   LDLCALC 112 02/22/2014   TRIG 86 02/22/2014   CHOLHDL 4.0 01/19/2014    Imaging and Procedures: 01/19/14:  CT brain w/o contrast:  No acute intracranial abnormalities; chronic atrophy and small vessel ischemic changes  01/19/14:  CXR:  No active cardiopulmonary disease.  01/19/14:  MRI/MRA head/brain w/o contrast:    MRI HEAD No acute infarct. Significant small vessel disease type changes with confluent white matter type changes subcortical/ periventricular region. Atrophy. Ventricular prominence probably related to atrophy although difficult to completely exclude a mild  component of hydrocephalus. No intracranial hemorrhage. No intracranial mass lesion noted on this unenhanced exam. Cervical spondylotic changes with spinal stenosis and cord flattening C3-4 and C4-5 level. If further delineation is clinically desired, MR of the cervical spine may be considered.  MRA HEAD Prominent intracranial atherosclerotic type changes as detailed above.  Assessment/Plan 1. Nerve compression syndrome -right arm   -has come to rehab for this--getting PT, has now completed OT -strength now equal on exam today  2. Hyperlipidemia -f/u lipids after statin initiated indicate slight LDL elevation -will increase lipitor to 20mg  for therapeutic effect  3. Weakness -is frail 81 yo -continues on her PT  4. Protein-calorie malnutrition, severe -foods are cut into bite-sized pieces for her -on regular diet with thin liquids -gets an ensure with breakfast  5. Gastroesophageal reflux disease, esophagitis presence not specified -continues on protonix 40mg  daily  6. Senile osteoporosis -has not tolerated bisphosphonates in the past -not currently on any vitamin D supplements either -start vitamin D 2000 units daily  7.  HOH:  Wears hearing aides, very [place] without these  Family/ staff Communication: discussed with pt, nursing staff  Labs/tests ordered:  No new needed at present

## 2014-03-25 ENCOUNTER — Non-Acute Institutional Stay: Payer: Medicare Other | Admitting: Adult Health

## 2014-03-25 ENCOUNTER — Non-Acute Institutional Stay (SKILLED_NURSING_FACILITY): Payer: Medicare Other | Admitting: Adult Health

## 2014-03-25 ENCOUNTER — Encounter: Payer: Self-pay | Admitting: Adult Health

## 2014-03-25 DIAGNOSIS — E785 Hyperlipidemia, unspecified: Secondary | ICD-10-CM | POA: Diagnosis not present

## 2014-03-25 DIAGNOSIS — G589 Mononeuropathy, unspecified: Secondary | ICD-10-CM

## 2014-03-25 DIAGNOSIS — K219 Gastro-esophageal reflux disease without esophagitis: Secondary | ICD-10-CM | POA: Diagnosis not present

## 2014-03-25 DIAGNOSIS — R413 Other amnesia: Secondary | ICD-10-CM | POA: Diagnosis not present

## 2014-03-25 DIAGNOSIS — R531 Weakness: Secondary | ICD-10-CM

## 2014-03-25 DIAGNOSIS — G459 Transient cerebral ischemic attack, unspecified: Secondary | ICD-10-CM

## 2014-03-25 NOTE — Progress Notes (Signed)
Patient ID: Tanya Leblanc, female   DOB: 15-Dec-1924, 81 y.o.   MRN: [id number]    Nursing Home Location:  [place]   Place of Service: SNF (31)    Allergies  Allergen Reactions  . Actonel [Risedronate] Other (See Comments)    Per patients mar  . Boniva [Ibandronic Acid] Other (See Comments)    Per patiens mar  . Fosamax [Alendronate] Other (See Comments)    Per patiens mar  . Miacalcin [Calcitonin] Other (See Comments)    Per patients mar  . Sulfa Antibiotics Itching    Chief Complaint  Patient presents with  . Medical Management of Chronic Issues    HPI:  Patient is a 81 y.o. female seen today at [facility] in the rehab section. She was admitted to rehab after a fall and subsequent hospitalization (01/19/14-01/20/14).  She was found to have a TIA and nerve compression to the right arm. She was started on a statin and aspirin therapy. She decided to discontinue the statin due to cost. She also expressed that she does not like to take medication. She reports that she is feeling well today and has no complaints. She has a hx of memory loss and she scored 24/30 on the MMSE with a failed clock on 01/26/14.  She is still ambulatory with a walker and continent.  She was treated for a UTI last month with Cipro (>100,000 colonies of E.Coli) and today she denies any s/p pain or dysuria.   Review of Systems:  Review of Systems  Constitutional: Negative for fever, chills, diaphoresis, activity change, appetite change and fatigue.  HENT: Negative for trouble swallowing.   Respiratory: Negative for cough, shortness of breath and wheezing.   Cardiovascular: Positive for leg swelling. Negative for chest pain and palpitations.  Gastrointestinal: Negative for abdominal pain, constipation and abdominal distention.  Genitourinary: Negative for dysuria, frequency, hematuria, flank pain, difficulty urinating and pelvic pain.  Neurological: Negative for  dizziness, tremors, facial asymmetry, speech difficulty and weakness.  Psychiatric/Behavioral: Positive for confusion.    Past Medical History  Diagnosis Date  . Scoliosis   . Low back pain   . Chronic kidney disease   . Amnesia   . Cancer   . Breast CA   . Melanoma   . GERD (gastroesophageal reflux disease)   . Osteoporosis    Past Surgical History  Procedure Laterality Date  . Mastectomy Left   . Breast surgery      reconstructive  . Cataract extraction Bilateral    Social History:   reports that she has never smoked. She does not have any smokeless tobacco history on file. She reports that she drinks alcohol. She reports that she does not use illicit drugs.  History reviewed. No pertinent family history.  Medications: Patient's Medications  New Prescriptions   No medications on file  Previous Medications   ACETAMINOPHEN (TYLENOL) 500 MG TABLET    Take 1 tablet (500 mg total) by mouth every 6 (six) hours as needed for mild pain.   ASPIRIN EC 81 MG EC TABLET    Take 1 tablet (81 mg total) by mouth daily.   PANTOPRAZOLE (PROTONIX) 40 MG TABLET    Take 40 mg by mouth daily.  Modified Medications   No medications on file  Discontinued Medications   ATORVASTATIN (LIPITOR) 20 MG TABLET    Take 1 tablet (20 mg total) by mouth daily.     Physical Exam: Filed Vitals:   03/25/14 1622  BP: 162/82  Pulse: 74  Temp: 97 F (36.1 C)  Resp: 18  Weight: 110 lb (49.896 kg)  SpO2: 97%    Physical Exam  Constitutional: No distress.  frail  Neck: No JVD present. No tracheal deviation present. No thyromegaly present.  Cardiovascular: Normal rate and regular rhythm.   No murmur heard. Pedal edema bilat  Pulmonary/Chest: Effort normal and breath sounds normal. No respiratory distress. She has no wheezes.  Abdominal: Soft. Bowel sounds are normal. She exhibits no distension. There is no tenderness.  No flank tenderness  Musculoskeletal:  MAE.  Severe scoliosis.    Lymphadenopathy:    She has no cervical adenopathy.  Neurological: She is alert.  Oriented x 2.  Skin: Skin is warm and dry. She is not diaphoretic.  Psychiatric: She has a normal mood and affect.    Labs reviewed: Basic Metabolic Panel:  Recent Labs  01/19/14 0244 01/19/14 0254 02/22/14  NA 135 137 134*  K 3.6 3.8 4.2  CL 101 100  --   CO2 26  --   --   GLUCOSE 173* 176*  --   BUN 22 22 28*  CREATININE 1.22* 1.30* 1.1  CALCIUM 9.3  --   --    Liver Function Tests:  Recent Labs  01/19/14 0244  AST 45*  ALT 35  ALKPHOS 99  BILITOT 0.6  PROT 7.0  ALBUMIN 3.7   No results for input(s): LIPASE, AMYLASE in the last 8760 hours. No results for input(s): AMMONIA in the last 8760 hours. CBC:  Recent Labs  01/19/14 0244 01/19/14 0254 02/22/14  WBC 13.4*  --  8.7  NEUTROABS 11.8*  --   --   HGB 12.6 13.6 11.1*  HCT 38.5 40.0 34*  MCV 91.0  --   --   PLT 364  --  385   TSH: No results for input(s): TSH in the last 8760 hours. A1C: Lab Results  Component Value Date   HGBA1C 5.8* 01/19/2014   Lipid Panel:  Recent Labs  01/19/14 0244 02/22/14  CHOL 223* 186  HDL 56 57  LDLCALC 153* 112  TRIG 72 86  CHOLHDL 4.0  --     Assessment/Plan  1. Nerve compression syndrome Resolved. She is moving her arms equally and denies numbness. She is able to use the walker and ambulate but still calls the nurse for assistance to prevent falls.  2. Transient cerebral ischemia, unspecified transient cerebral ischemia type Continue ASA, no bleeding noted.  3. Gastroesophageal reflux disease, esophagitis presence not specified Stable, continue Protonix  4. Hyperlipidemia No need for further monitoring since she declined statin therapy  5. Memory loss Would like to start Aricept but given her low weight and dislike for meds will defer at this time   [name], [place] [phone]

## 2014-03-29 NOTE — Progress Notes (Signed)
Patient ID: Tanya Leblanc, female   DOB: 1924-03-11, 81 y.o.   MRN: [id number]    Nursing Home Location:  [facility]   Place of Service: SNF (31)    Allergies  Allergen Reactions  . Actonel [Risedronate] Other (See Comments)    Per patients mar  . Boniva [Ibandronic Acid] Other (See Comments)    Per patiens mar  . Fosamax [Alendronate] Other (See Comments)    Per patiens mar  . Miacalcin [Calcitonin] Other (See Comments)    Per patients mar  . Sulfa Antibiotics Itching    Chief Complaint  Patient presents with  . Medical Management of Chronic Issues    HPI:  Patient is a 81 y.o. female seen today at [facility] in the rehab section. She was admitted to rehab after a fall and subsequent hospitalization (01/19/14-01/20/14).  She was found to have a TIA and nerve compression to the right arm. She was started on a statin and aspirin therapy. She has completed therapy and no longer reports numbness or weakness in her arm. She declined statin therapy last month due to cost and her dislike for medication. She has complaints of pain today and is in good spirits.   Review of Systems:  Review of Systems  Constitutional: Negative for fever, chills, diaphoresis, activity change, appetite change and fatigue.  Respiratory: Negative for cough, shortness of breath and wheezing.   Cardiovascular: Positive for leg swelling. Negative for chest pain and palpitations.  Gastrointestinal: Negative for abdominal pain, constipation and abdominal distention.  Genitourinary: Negative for frequency and difficulty urinating.  Neurological: Negative for dizziness, tremors, syncope, facial asymmetry, speech difficulty, weakness and numbness.  Psychiatric/Behavioral: Positive for confusion. Negative for agitation.    Past Medical History  Diagnosis Date  . Scoliosis   . Low back pain   . Chronic kidney disease   . Amnesia   . Cancer   . Breast CA   .  Melanoma   . GERD (gastroesophageal reflux disease)   . Osteoporosis    Past Surgical History  Procedure Laterality Date  . Mastectomy Left   . Breast surgery      reconstructive  . Cataract extraction Bilateral    Social History:   reports that she has never smoked. She does not have any smokeless tobacco history on file. She reports that she drinks alcohol. She reports that she does not use illicit drugs.  No family history on file.  Medications: Patient's Medications  New Prescriptions   No medications on file  Previous Medications   ACETAMINOPHEN (TYLENOL) 500 MG TABLET    Take 1 tablet (500 mg total) by mouth every 6 (six) hours as needed for mild pain.   ASPIRIN EC 81 MG EC TABLET    Take 1 tablet (81 mg total) by mouth daily.   PANTOPRAZOLE (PROTONIX) 40 MG TABLET    Take 40 mg by mouth daily.  Modified Medications   No medications on file  Discontinued Medications   No medications on file     Physical Exam: Filed Vitals:   03/25/14 1608  BP: 162/82  Pulse: 74  Temp: 97 F (36.1 C)  Resp: 18  Weight: 110 lb (49.896 kg)  SpO2: 97%    Physical Exam  Constitutional: No distress.  frail  HENT:  Head: Normocephalic and atraumatic.  Neck: No JVD present.  Cardiovascular: Normal rate and regular rhythm.   No murmur heard. Pedal edema bilat  Pulmonary/Chest: Effort normal and breath sounds  normal. No respiratory distress. She has no wheezes.  Abdominal: Soft. Bowel sounds are normal. She exhibits no distension. There is no tenderness.  No flank tenderness  Musculoskeletal: She exhibits no edema or tenderness.  MAE.  Severe scoliosis.  Lymphadenopathy:    She has no cervical adenopathy.  Neurological: She is alert. No cranial nerve deficit.  Oriented x 2.  Skin: Skin is warm and dry. She is not diaphoretic.  Psychiatric: She has a normal mood and affect.    Labs reviewed: Basic Metabolic Panel:  Recent Labs  01/19/14 0244 01/19/14 0254 02/22/14    NA 135 137 134*  K 3.6 3.8 4.2  CL 101 100  --   CO2 26  --   --   GLUCOSE 173* 176*  --   BUN 22 22 28*  CREATININE 1.22* 1.30* 1.1  CALCIUM 9.3  --   --    Liver Function Tests:  Recent Labs  01/19/14 0244  AST 45*  ALT 35  ALKPHOS 99  BILITOT 0.6  PROT 7.0  ALBUMIN 3.7   No results for input(s): LIPASE, AMYLASE in the last 8760 hours. No results for input(s): AMMONIA in the last 8760 hours. CBC:  Recent Labs  01/19/14 0244 01/19/14 0254 02/22/14  WBC 13.4*  --  8.7  NEUTROABS 11.8*  --   --   HGB 12.6 13.6 11.1*  HCT 38.5 40.0 34*  MCV 91.0  --   --   PLT 364  --  385   TSH: No results for input(s): TSH in the last 8760 hours. A1C: Lab Results  Component Value Date   HGBA1C 5.8* 01/19/2014   Lipid Panel:  Recent Labs  01/19/14 0244 02/22/14  CHOL 223* 186  HDL 56 57  LDLCALC 153* 112  TRIG 72 86  CHOLHDL 4.0  --     Assessment/Plan  1. Transient cerebral ischemia, unspecified transient cerebral ischemia type No new events, continue ASA.   2. Memory loss Noted, needs MMSE. Would most likely not agree to medication.   3. Hyperlipidemia Off statin. No need to monitor further.   4. Weakness Improved  5. Nerve compression syndrome Resolved.    [name], ANP [facility] [phone]

## 2014-04-21 ENCOUNTER — Encounter: Payer: Self-pay | Admitting: Internal Medicine

## 2014-04-21 ENCOUNTER — Non-Acute Institutional Stay (SKILLED_NURSING_FACILITY): Payer: Medicare Other | Admitting: Internal Medicine

## 2014-04-21 DIAGNOSIS — F05 Delirium due to known physiological condition: Secondary | ICD-10-CM | POA: Diagnosis not present

## 2014-04-21 DIAGNOSIS — R509 Fever, unspecified: Secondary | ICD-10-CM

## 2014-04-21 LAB — BASIC METABOLIC PANEL
BUN: 24 mg/dL — AB (ref 4–21)
Creatinine: 1 mg/dL (ref 0.5–1.1)
GLUCOSE: 100 mg/dL
Potassium: 3.8 mmol/L (ref 3.4–5.3)
SODIUM: 137 mmol/L (ref 137–147)

## 2014-04-21 LAB — CBC AND DIFFERENTIAL
HEMATOCRIT: 31 % — AB (ref 36–46)
Hemoglobin: 10.1 g/dL — AB (ref 12.0–16.0)
Platelets: 441 10*3/uL — AB (ref 150–399)
WBC: 5.7 10^3/mL

## 2014-04-21 NOTE — Progress Notes (Signed)
Patient ID: Tanya Leblanc, female   DOB: 28-Feb-1924, 81 y.o.   MRN: [id number]  Location:  [facility] Provider:  Rexene Edison. Mariea Clonts, D.O., C.M.D.  Chief Complaint  Patient presents with  . Acute Visit    confusion, fever to 100.4 this am, no complaints  . Medical Management of Chronic Issues    HPI:  81 yo female here for rehab after fall with nerve compression of her arm.  This was initially thought to be a TIA but the arm weakness was the only symptom.  She has dementia but is not on medications.  She was seen last night wandering to the door of her room and undressing to use the restroom.  Normally, she independently uses the restroom.  She has been lethargic and confused this am, and noted to have a fever.  Nursing noted some decreased breath sounds at the bases.  Pt denies chills, cough, congestion, shortness of breath, abdominal or flank pain, dysuria, increased frequency or urgency.  She is having bms.  She has not fallen.  Review of Systems:  Review of Systems  Constitutional: Positive for fever and malaise/fatigue. Negative for chills.  HENT: Negative for congestion.   Respiratory: Negative for cough and shortness of breath.   Gastrointestinal: Negative for abdominal pain, constipation, blood in stool and melena.  Genitourinary: Negative for dysuria, urgency and frequency.  Musculoskeletal: Negative for falls.  Neurological: Negative for dizziness and weakness.  Psychiatric/Behavioral: Negative for memory loss.    Medications: Patient's Medications  New Prescriptions   No medications on file  Previous Medications   ACETAMINOPHEN (TYLENOL) 500 MG TABLET    Take 1 tablet (500 mg total) by mouth every 6 (six) hours as needed for mild pain.   ASPIRIN EC 81 MG EC TABLET    Take 1 tablet (81 mg total) by mouth daily.   PANTOPRAZOLE (PROTONIX) 40 MG TABLET    Take 40 mg by mouth daily.  Modified Medications   No medications on file  Discontinued Medications   No  medications on file    Physical Exam: Filed Vitals:   04/21/14 1523  BP: 153/80  Pulse: 75  Temp: 100.4 F (38 C)  Resp: 20  SpO2: 95%  Physical Exam  Constitutional: She appears well-developed and well-nourished. No distress.  Cardiovascular: Normal rate, regular rhythm and normal heart sounds.   Pulmonary/Chest: Effort normal and breath sounds normal. No respiratory distress. She has no wheezes. She has no rales.  Abdominal: Soft. Bowel sounds are normal. She exhibits no distension and no mass. There is no tenderness.  Musculoskeletal: Normal range of motion. She exhibits no tenderness.  Neurological:  Lethargic, oriented to person only, falling asleep during exam    Labs reviewed: Basic Metabolic Panel:  Recent Labs  01/19/14 0244 01/19/14 0254 02/22/14  NA 135 137 134*  K 3.6 3.8 4.2  CL 101 100  --   CO2 26  --   --   GLUCOSE 173* 176*  --   BUN 22 22 28*  CREATININE 1.22* 1.30* 1.1  CALCIUM 9.3  --   --     Liver Function Tests:  Recent Labs  01/19/14 0244  AST 45*  ALT 35  ALKPHOS 99  BILITOT 0.6  PROT 7.0  ALBUMIN 3.7    CBC:  Recent Labs  01/19/14 0244 01/19/14 0254 02/22/14  WBC 13.4*  --  8.7  NEUTROABS 11.8*  --   --   HGB 12.6 13.6 11.1*  HCT  38.5 40.0 34*  MCV 91.0  --   --   PLT 364  --  385   Assessment/Plan 1. Acute hypoactive delirium due to another medical condition -check cbc, cmp, UA c+s stat to evaluate -unfortunately, no clear localizing symptoms  2.  Fever:  Cause unknown--suspect UTI -encourage hydration and await labs  Family/ staff Communication: discussed with nurse manager

## 2014-04-26 NOTE — Addendum Note (Signed)
Addended by: Barnie Mort on: 04/26/2014 12:27 PM   Modules accepted: Level of Service

## 2014-04-29 NOTE — Addendum Note (Signed)
Addended by: Barnie Mort on: 04/29/2014 04:53 PM   Modules accepted: Level of Service

## 2014-05-03 NOTE — Addendum Note (Signed)
Addended by: Barnie Mort on: 05/03/2014 01:22 PM   Modules accepted: Level of Service, SmartSet

## 2014-05-03 NOTE — Progress Notes (Signed)
This encounter was created in error - please disregard. This encounter was created in error - please disregard. This encounter was created in error - please disregard. 

## 2014-05-03 NOTE — Addendum Note (Signed)
Addended by: Barnie Mort on: 05/03/2014 01:19 PM   Modules accepted: Level of Service, SmartSet

## 2014-05-03 NOTE — Progress Notes (Signed)
This encounter was created in error - please disregard.

## 2014-05-27 ENCOUNTER — Non-Acute Institutional Stay (SKILLED_NURSING_FACILITY): Payer: Medicare Other | Admitting: Adult Health

## 2014-05-27 DIAGNOSIS — R413 Other amnesia: Secondary | ICD-10-CM

## 2014-05-27 DIAGNOSIS — G459 Transient cerebral ischemic attack, unspecified: Secondary | ICD-10-CM

## 2014-05-27 DIAGNOSIS — D649 Anemia, unspecified: Secondary | ICD-10-CM | POA: Diagnosis not present

## 2014-05-27 DIAGNOSIS — K219 Gastro-esophageal reflux disease without esophagitis: Secondary | ICD-10-CM

## 2014-05-27 DIAGNOSIS — M810 Age-related osteoporosis without current pathological fracture: Secondary | ICD-10-CM | POA: Diagnosis not present

## 2014-05-27 NOTE — Progress Notes (Signed)
Patient ID: Tanya Leblanc, female   DOB: [birth year], 81 y.o.   MRN: [id number]  Location:  [facility]  Code status DNR  Chief Complaint  Patient presents with  . Medical Management of Chronic Issues   HPI:  81 y.o.female  residing at [facility], skilled care. I am here to review her chronic medical issues.  VS have been stable over the past month. Her weight has remained stable at 110 lbs. She has a hx of progressive memory loss, scoliosis, TIA, GERD, and OP.  She currently is ambulatory with a walker. She has a chair alarm as she is not safe to get up on her own and is at risk for falls. She has no complaints today. She has a hx of TIA (questionable) and was placed on asa and statin. She refused to take the statin so it was d/c'd.  She is on senna for constipation and frequently refuses to take it. There is a request from pharmacy to d/c her protonix if it is not needed. She denies heartburn and chest pain. There are no reports of GI bleed. She has progressive memory loss and had declined medication. She has no hx of behavioral disturbance. She has wandered out of her room before but this is infrequent. She has refused meds for dementia. Her last BIMs score was 6/15 on 04/16/14.    Review of Systems:  Review of Systems  Constitutional: Negative for fever, chills, weight loss and malaise/fatigue.  Respiratory: Negative for cough and shortness of breath.   Cardiovascular: Negative for chest pain and leg swelling.  Gastrointestinal: Negative for heartburn, nausea, vomiting and abdominal pain.  Genitourinary: Negative for dysuria.  Musculoskeletal: Positive for falls. Negative for myalgias and joint pain.  Skin: Negative for rash.  Neurological: Negative for dizziness, tremors, focal weakness and loss of consciousness.  Psychiatric/Behavioral: Positive for memory loss. Negative for depression. The patient is not nervous/anxious.        Medications: Patient's Medications  New Prescriptions   No medications on file  Previous Medications   ACETAMINOPHEN (TYLENOL) 500 MG TABLET    Take 1 tablet (500 mg total) by mouth every 6 (six) hours as needed for mild pain.   ASPIRIN EC 81 MG EC TABLET    Take 1 tablet (81 mg total) by mouth daily.   PANTOPRAZOLE (PROTONIX) 40 MG TABLET    Take 40 mg by mouth daily.  Modified Medications   No medications on file  Discontinued Medications   No medications on file    Physical Exam: Filed Vitals:   05/27/14 1129  BP: 130/67  Pulse: 92  Temp: 97.1 F (36.2 C)  Resp: 18  Weight: 110 lb 3.2 oz (49.986 kg)  SpO2: 96%   Wt Readings from Last 3 Encounters:  05/27/14 110 lb 3.2 oz (49.986 kg)  03/25/14 110 lb (49.896 kg)  03/25/14 110 lb (49.896 kg)     Physical Exam  Constitutional: Nod distress. Frail, thin female Cardiovascular: Normal rate, regular rhythm and normal heart sounds.   Pulmonary/Chest: Effort normal and breath sounds normal. No respiratory distress. She has no wheezes. She has no rales.  Abdominal: Soft. Bowel sounds are normal. She exhibits no distension and no mass. There is no tenderness.  Musculoskeletal: Normal range of motion. She exhibits no tenderness. Scoliosis noted Neurological: Alert, oriented to self and situation. Able to follow commands. Slow to speak. No rigidity or tremor   Labs reviewed: Basic Metabolic Panel:  Recent Labs  01/19/14 0244 01/19/14 0254 02/22/14 04/21/14  NA 135 137 134* 137  K 3.6 3.8 4.2 3.8  CL 101 100  --   --   CO2 26  --   --   --   GLUCOSE 173* 176*  --   --   BUN 22 22 28* 24*  CREATININE 1.22* 1.30* 1.1 1.0  CALCIUM 9.3  --   --   --     Liver Function Tests:  Recent Labs  01/19/14 0244  AST 45*  ALT 35  ALKPHOS 99  BILITOT 0.6  PROT 7.0  ALBUMIN 3.7    CBC:  Recent Labs  01/19/14 0244 01/19/14 0254 02/22/14 04/21/14  WBC 13.4*  --  8.7 5.7  NEUTROABS 11.8*  --   --   --   HGB  12.6 13.6 11.1* 10.1*  HCT 38.5 40.0 34* 31*  MCV 91.0  --   --   --   PLT 364  --  385 441*   Assessment/Plan  1. Memory loss Stable. Needs skilled care to address ADL's.  Continue to encourage activities. No meds due to pt request.  2. Senile osteoporosis Continue Vit D and add Calcium 600mg  BID  3. Gastroesophageal reflux disease, esophagitis presence not specified Will trial off protonix  4. Transient cerebral ischemia, unspecified transient cerebral ischemia type Continue ASA  5. Anemia, unspecified anemia type Trending down, normocytic, normochromic, no signs of bleeding.  Check iron, TIBC, %sat, ferritin    [name], ANP [facility] [phone]

## 2014-07-23 ENCOUNTER — Non-Acute Institutional Stay (SKILLED_NURSING_FACILITY): Payer: Medicare Other | Admitting: Adult Health

## 2014-07-23 ENCOUNTER — Encounter: Payer: Self-pay | Admitting: Adult Health

## 2014-07-23 DIAGNOSIS — M4120 Other idiopathic scoliosis, site unspecified: Secondary | ICD-10-CM | POA: Diagnosis not present

## 2014-07-23 DIAGNOSIS — R413 Other amnesia: Secondary | ICD-10-CM

## 2014-07-23 DIAGNOSIS — M810 Age-related osteoporosis without current pathological fracture: Secondary | ICD-10-CM

## 2014-07-23 DIAGNOSIS — D649 Anemia, unspecified: Secondary | ICD-10-CM | POA: Diagnosis not present

## 2014-07-23 NOTE — Progress Notes (Signed)
Patient ID: Tanya Leblanc, female   DOB: Sep 20, 1924, 81 y.o.   MRN: [id number]  Location:  [facility]  Code status DNR  Chief Complaint  Patient presents with  . Acute Visit    low back pain  . Medical Management of Chronic Issues   HPI:  81 y.o.female  residing at [facility], skilled care. I am here to review her chronic medical issues.  VS have been stable over the past month. Her weight has remained stable at 108 lbs with some variability. She has a hx of progressive memory loss, scoliosis,breast ca, melanoma,  TIA, GERD, and OP.  She currently is ambulatory with a walker.   The staff reports that she is having back pain with ambulation. She is currently working with physical therapy.  She denies radiculopathy. Her pain is in the lumbar region of her back and worsens when standing for prolonged periods of time.   She has a hx of OP and did not tolerate boniva, fosamax, actonel, or miacalcin in the past due to dyspepsia. She is currently on Vit D and calcium.   She has progressive memory loss and had declined medication. Her last MMSE was 24/30.    She is on asa for a hx of TIA and has mild iron deficiency anemia.   Review of Systems:  Review of Systems  Constitutional: Negative for fever, chills, weight loss and malaise/fatigue.  Respiratory: Negative for cough and shortness of breath.   Cardiovascular: Negative for chest pain and leg swelling.  Gastrointestinal: Negative for heartburn, nausea, vomiting and abdominal pain.  Genitourinary: Negative for dysuria.  Musculoskeletal: Positive for back pain and falls. Negative for myalgias and joint pain.       Severe scoliosis  Skin: Negative for rash.  Neurological: Negative for dizziness, tremors, focal weakness and loss of consciousness.  Psychiatric/Behavioral: Positive for memory loss. Negative for depression. The patient is not nervous/anxious.       Medications: Patient's Medications    New Prescriptions   No medications on file  Previous Medications   ACETAMINOPHEN (TYLENOL) 500 MG TABLET    Take 1 tablet (500 mg total) by mouth every 6 (six) hours as needed for mild pain.   ASPIRIN EC 81 MG EC TABLET    Take 1 tablet (81 mg total) by mouth daily.   CALCIUM CARBONATE (OS-CAL) 600 MG TABS TABLET    Take 600 mg by mouth 2 (two) times daily with a meal.   SENNA (SENOKOT) 8.6 MG TABS TABLET    Take 1 tablet by mouth 2 (two) times daily.  Modified Medications   No medications on file  Discontinued Medications   No medications on file    Physical Exam: Filed Vitals:   07/23/14 1118  BP: 133/80  Pulse: 88  Temp: 98.4 F (36.9 C)  Resp: 18  Weight: 108 lb 12.8 oz (49.351 kg)  SpO2: 97%   Wt Readings from Last 3 Encounters:  07/23/14 108 lb 12.8 oz (49.351 kg)  05/27/14 110 lb 3.2 oz (49.986 kg)  03/25/14 110 lb (49.896 kg)     Physical Exam  Constitutional: No distress.  [name], thin  Neck: No JVD present. No tracheal deviation present. No thyromegaly present.  Cardiovascular: Normal rate and regular rhythm.   Trace edema bilaterally. Varicosities noted to both feet/ankles  Pulmonary/Chest: Effort normal and breath sounds normal. No respiratory distress.  Decreased bases  Abdominal: Soft. Bowel sounds are normal. She exhibits no distension. There is  no tenderness.  Musculoskeletal: She exhibits no edema or tenderness.  No spinous process tenderness  Lymphadenopathy:    She has no cervical adenopathy.  Neurological: She is alert. No cranial nerve deficit.  Oriented x 2. Able to f/c and answer q's  Skin: She is not diaphoretic.      Labs reviewed: Basic Metabolic Panel:  Recent Labs  01/19/14 0244 01/19/14 0254 02/22/14 04/21/14  NA 135 137 134* 137  K 3.6 3.8 4.2 3.8  CL 101 100  --   --   CO2 26  --   --   --   GLUCOSE 173* 176*  --   --   BUN 22 22 28* 24*  CREATININE 1.22* 1.30* 1.1 1.0  CALCIUM 9.3  --   --   --     Liver Function  Tests:  Recent Labs  01/19/14 0244  AST 45*  ALT 35  ALKPHOS 99  BILITOT 0.6  PROT 7.0  ALBUMIN 3.7    CBC:  Recent Labs  01/19/14 0244 01/19/14 0254 02/22/14 04/21/14  WBC 13.4*  --  8.7 5.7  NEUTROABS 11.8*  --   --   --   HGB 12.6 13.6 11.1* 10.1*  HCT 38.5 40.0 34* 31*  MCV 91.0  --   --   --   PLT 364  --  385 441*   06/01/14 iron 41, TIBC 323, %sat 11, ferritin 11  Assessment/Plan  1. Scoliosis (and kyphoscoliosis), idiopathic Continue PT Try Tylenol 1 gram BID for pain, if no improvement will obtain xray to guide further management  2. Anemia, unspecified anemia type -Mild iron def.   -Check stool for hemoccult x 2 since she is on asa -consider iron if worsens -recheck in 6 weeks  3. Memory loss Stable, not on meds Continue supportive care  4. Senile osteoporosis Continue calcium and Vit D     [name], ANP [facility] [phone]

## 2014-08-03 ENCOUNTER — Non-Acute Institutional Stay (SKILLED_NURSING_FACILITY): Payer: Medicare Other | Admitting: Internal Medicine

## 2014-08-03 DIAGNOSIS — K5900 Constipation, unspecified: Secondary | ICD-10-CM

## 2014-08-03 DIAGNOSIS — K449 Diaphragmatic hernia without obstruction or gangrene: Secondary | ICD-10-CM | POA: Diagnosis not present

## 2014-08-03 DIAGNOSIS — K2100 Gastro-esophageal reflux disease with esophagitis, without bleeding: Secondary | ICD-10-CM

## 2014-08-03 DIAGNOSIS — K648 Other hemorrhoids: Secondary | ICD-10-CM | POA: Diagnosis not present

## 2014-08-03 DIAGNOSIS — K644 Residual hemorrhoidal skin tags: Secondary | ICD-10-CM

## 2014-08-03 DIAGNOSIS — M810 Age-related osteoporosis without current pathological fracture: Secondary | ICD-10-CM | POA: Diagnosis not present

## 2014-08-03 DIAGNOSIS — M4120 Other idiopathic scoliosis, site unspecified: Secondary | ICD-10-CM

## 2014-08-03 DIAGNOSIS — K5909 Other constipation: Secondary | ICD-10-CM

## 2014-08-03 NOTE — Progress Notes (Signed)
Patient ID: Tanya Leblanc, female   DOB: April 03, 1924, 81 y.o.   MRN: [id number]  Location: [facility] Provider:  Rexene Edison. Mariea Clonts, D.O., C.M.D.  Code Status:  DNR Goals of care: Advanced Directive information Does patient have an advance directive?: Yes, Type of Advance Directive: [place];Out of facility DNR (pink MOST or yellow form);Living will, Pre-existing out of facility DNR order (yellow form or pink MOST form): Yellow form placed in chart (order not valid for inpatient use)  Chief Complaint  Patient presents with  . Acute Visit    family requested meeting with me as pt has moved care since admission to SNF here at [facility]; also has had 2 heme positive stools since 07/23/14    HPI:  81 yo female with h/o dementia, senile osteoporosis, scoliosis, GERD, CKD, low back pain, breast cancer was seen for an acute visit at the request of her daughter, Abigail Butts.  Mrs. Trevizo had been living independently until a fall led to her transfer to [place].  She also was having more and more difficulty with her cognition.  She then had a bad fall in [place] and was hospitalized for "TIA" which turned out to be peripheral nerve compression from landing on her right arm.  Her cognition did decline again after that event in January.  She was then in rehab from Feb to May of this year when she moved to SNF.    She has been working with PT and her walker has been adjusted so the handles are lopsided to accommodate her severe kyphoscoliosis.  Her posture is so stooped that she cannot see in front of her when ambulating.  This pain has been under fair control.  She's inactive except when working with therapy.  She also has had difficulty with indigestion recently.  She's had zantac scheduled daily, but this has been ineffective.  She admits to some of this today and points to her midchest, upper abdomen as the source.  It happens after meals.  She also has a hiatal hernia.  Her omeprazole had  been stopped as part of the pharmacy recommendations.  Her daughter requests that we resume this which seems appropriate at this time--they would like her zantac made prn.    She has been constipated and takes calcium regularly.  She does eat milk products regularly.  We opted to d/c calcium and just continued the vitamin D at this point.  She needs a bone density study to see if she should be on prolia. She was on oral bisphosphonates in the past and could not tolerate them (GI side effects).  In reference to her constipation, her heme positive stools were related to hemorrhoidal bleeding after hard stools.  We will f/u her labs to assess cbc.  Review of Systems:  Review of Systems  Constitutional: Positive for weight loss. Negative for fever and chills.  HENT: Positive for hearing loss. Negative for congestion.        HOH  Respiratory: Negative for shortness of breath.   Cardiovascular: Negative for chest pain and palpitations.  Gastrointestinal: Positive for heartburn, constipation and blood in stool. Negative for nausea, vomiting, abdominal pain, diarrhea and melena.  Genitourinary: Negative for dysuria.  Musculoskeletal: Positive for back pain and falls.       Kyphoscoliosis  Skin: Negative for rash.  Neurological: Positive for weakness. Negative for dizziness and loss of consciousness.  Endo/Heme/Allergies: Bruises/bleeds easily.  Psychiatric/Behavioral: Positive for memory loss. Negative for depression. The patient is  not nervous/anxious and does not have insomnia.     Past Medical History  Diagnosis Date  . Scoliosis   . Low back pain   . Chronic kidney disease   . Amnesia   . Cancer   . Breast CA   . Melanoma   . GERD (gastroesophageal reflux disease)   . Osteoporosis     Patient Active Problem List   Diagnosis Date Noted  . Scoliosis (and kyphoscoliosis), idiopathic 07/23/2014  . Anemia 05/27/2014  . Memory loss 03/25/2014  . GERD (gastroesophageal reflux disease)  02/23/2014  . Senile osteoporosis 02/23/2014  . Hard of hearing 02/23/2014  . Hyperlipidemia 02/22/2014  . Nerve compression syndrome 01/20/2014  . Weakness 01/19/2014  . TIA (transient ischemic attack) 01/19/2014  . Protein-calorie malnutrition, severe 01/19/2014    Allergies  Allergen Reactions  . Actonel [Risedronate] Other (See Comments)    Per patients mar  . Boniva [Ibandronic Acid] Other (See Comments)    Per patiens mar  . Fosamax [Alendronate] Other (See Comments)    Per patiens mar  . Miacalcin [Calcitonin] Other (See Comments)    Per patients mar  . Sulfa Antibiotics Itching    Medications: Patient's Medications  New Prescriptions   No medications on file  Previous Medications   ACETAMINOPHEN (TYLENOL) 500 MG TABLET    Take 1 tablet (500 mg total) by mouth every 6 (six) hours as needed for mild pain.   ASPIRIN EC 81 MG EC TABLET    Take 1 tablet (81 mg total) by mouth daily.   CALCIUM CARBONATE (OS-CAL) 600 MG TABS TABLET    Take 600 mg by mouth 2 (two) times daily with a meal.   SENNA (SENOKOT) 8.6 MG TABS TABLET    Take 1 tablet by mouth 2 (two) times daily.  Modified Medications   No medications on file  Discontinued Medications   No medications on file    Physical Exam: Filed Vitals:   08/03/14 0853  BP: 146/75  Pulse: 73  Temp: 96.4 F (35.8 C)  Resp: 17  Height: 5' (1.524 m)  Weight: 109 lb (49.442 kg)  SpO2: 98%   Body mass index is 21.29 kg/(m^2).  Physical Exam  Constitutional:  Frail white female resting in recliner chair in her snf room  HENT:  Head: Normocephalic and atraumatic.  Cardiovascular: Normal rate, regular rhythm, normal heart sounds and intact distal pulses.   Pulmonary/Chest: Effort normal and breath sounds normal. No respiratory distress. She has no wheezes.  Abdominal: Soft. Bowel sounds are normal. She exhibits no distension. There is no tenderness.  Musculoskeletal: She exhibits no tenderness.  Very slow shuffling  gait, stooped posture with severe kyphoscoliosis, uses rollator walker  Neurological: She is alert.  Is sleepy during visit; oriented to person and place, not time, repeats her questions   Skin: Skin is warm and dry.    Labs reviewed: Basic Metabolic Panel:  Recent Labs  01/19/14 0244 01/19/14 0254 02/22/14 04/21/14  NA 135 137 134* 137  K 3.6 3.8 4.2 3.8  CL 101 100  --   --   CO2 26  --   --   --   GLUCOSE 173* 176*  --   --   BUN 22 22 28* 24*  CREATININE 1.22* 1.30* 1.1 1.0  CALCIUM 9.3  --   --   --     Liver Function Tests:  Recent Labs  01/19/14 0244  AST 45*  ALT 35  ALKPHOS 99  BILITOT  0.6  PROT 7.0  ALBUMIN 3.7    CBC:  Recent Labs  01/19/14 0244 01/19/14 0254 02/22/14 04/21/14  WBC 13.4*  --  8.7 5.7  NEUTROABS 11.8*  --   --   --   HGB 12.6 13.6 11.1* 10.1*  HCT 38.5 40.0 34* 31*  MCV 91.0  --   --   --   PLT 364  --  385 441*    No results found for: TSH Lab Results  Component Value Date   HGBA1C 5.8* 01/19/2014   Lab Results  Component Value Date   CHOL 186 02/22/2014   HDL 57 02/22/2014   LDLCALC 112 02/22/2014   TRIG 86 02/22/2014   CHOLHDL 4.0 01/19/2014    Patient Care Team: Gayland Curry, DO as PCP - General (Geriatric Medicine)  Assessment/Plan 1. Scoliosis (and kyphoscoliosis), idiopathic - has had longstanding, but it's been progressive - continue therapy on her gait and looking ahead when walking with her walker as well as strengthening -cont tylenol for back pain  2. Senile osteoporosis -cont vitamin D 2000 units daily -d/c calcium due to constipation -cont high calcium foods like yogurt, cottage cheese, ice cream and milk  3. Chronic constipation -due to immobility, inadequate water, posture, age, and calcium supplement -d/c calcium -try to increase activity beyond just working with therapy--walking with help a few times per day -increase water intake  4. Gastroesophageal reflux disease with  esophagitis -has been worse since omeprazole was stopped--will restart and then make zantac prn after one week (may take that long for omeprazole to be effective) for episodes unresolved with the omeprazole like when she eats the wrong food  5. Hiatal hernia -makes her gerd worse -resume omeprazole longstanding  6. Bleeding external hemorrhoids -felt to be cause of heme positive stools x 2 -she and her daughter do not want this aggressively pursued if the cause were something else -will work on decreasing constipation -can use anusol if hemorrhoids are painful or pruritic, but she denies this -f/u cbc  Family/ staff Communication: spent 35 minutes with patient and her daughter today discussing above conditions and her history, performing exam and ordering labs  Labs/tests ordered:  Cbc, cmp  Madylin Fairbank L. Kyliyah Stirn, D.O. [facility] [address], [place] [address] Cell Phone (Mon-Fri 8am-5pm):  [phone] On Call:  [phone] & follow prompts after 5pm & weekends Office Phone:  [phone] Office Fax:  [phone]

## 2014-08-05 LAB — HEPATIC FUNCTION PANEL
ALT: 20 U/L (ref 7–35)
AST: 20 U/L (ref 13–35)
Alkaline Phosphatase: 46 U/L (ref 25–125)
BILIRUBIN, TOTAL: 0.2 mg/dL

## 2014-08-05 LAB — CBC AND DIFFERENTIAL
HEMATOCRIT: 29 % — AB (ref 36–46)
Hemoglobin: 9.8 g/dL — AB (ref 12.0–16.0)
PLATELETS: 407 10*3/uL — AB (ref 150–399)
WBC: 5.3 10*3/mL

## 2014-08-05 LAB — BASIC METABOLIC PANEL
BUN: 16 mg/dL (ref 4–21)
Creatinine: 1.2 mg/dL — AB (ref 0.5–1.1)
Glucose: 90 mg/dL
Potassium: 4 mmol/L (ref 3.4–5.3)
Sodium: 141 mmol/L (ref 137–147)

## 2014-08-10 ENCOUNTER — Encounter: Payer: Self-pay | Admitting: Internal Medicine

## 2014-08-10 MED ORDER — OMEPRAZOLE 20 MG PO CPDR: 20.0000 mg | DELAYED_RELEASE_CAPSULE | Freq: Every day | ORAL | Status: DC

## 2014-08-10 MED ORDER — VITAMIN D 50 MCG (2000 UT) PO CAPS: 1.0000 | ORAL_CAPSULE | Freq: Every day | ORAL | Status: DC

## 2014-08-27 ENCOUNTER — Telehealth: Payer: Self-pay

## 2014-08-27 NOTE — Telephone Encounter (Signed)
Beth from [facility] left message on triage voicemail that patient had fallen, hit head, doing neuro check, skin tear right knee, able to walk, no complain from patient. Dx osteoporosis. Called [facility] ask for them to write this and fax to office, the doctor can sign and fax back.

## 2014-09-23 ENCOUNTER — Encounter: Payer: Self-pay | Admitting: Adult Health

## 2014-09-23 ENCOUNTER — Non-Acute Institutional Stay (SKILLED_NURSING_FACILITY): Payer: Medicare Other | Admitting: Adult Health

## 2014-09-23 DIAGNOSIS — M810 Age-related osteoporosis without current pathological fracture: Secondary | ICD-10-CM

## 2014-09-23 DIAGNOSIS — D649 Anemia, unspecified: Secondary | ICD-10-CM

## 2014-09-23 DIAGNOSIS — M4120 Other idiopathic scoliosis, site unspecified: Secondary | ICD-10-CM

## 2014-09-23 DIAGNOSIS — R413 Other amnesia: Secondary | ICD-10-CM | POA: Diagnosis not present

## 2014-09-23 DIAGNOSIS — K5901 Slow transit constipation: Secondary | ICD-10-CM

## 2014-09-23 DIAGNOSIS — K5900 Constipation, unspecified: Secondary | ICD-10-CM | POA: Insufficient documentation

## 2014-09-23 LAB — CBC AND DIFFERENTIAL
HCT: 31 % — AB (ref 36–46)
Hemoglobin: 9.9 g/dL — AB (ref 12.0–16.0)
Platelets: 534 10*3/uL — AB (ref 150–399)
WBC: 6.5 10^3/mL

## 2014-09-23 NOTE — Progress Notes (Signed)
Patient ID: Tanya Leblanc, female   DOB: 02-02-1924, 81 y.o.   MRN: [id number]  Location:  [facility]  Code status DNR  Chief Complaint  Patient presents with  . Medical Management of Chronic Issues   HPI:  81 y.o.female  residing at [facility], skilled care. I am here to review her chronic medical issues.  VS have been stable over the past month. Her weight has remained stable at 110 lbs with some variability. She has a hx of progressive memory loss, scoliosis,breast ca, melanoma,  Possible TIA, GERD, and OP.  She currently is ambulatory with a walker.   She has a hx of OP and did not tolerate boniva, fosamax, actonel, or miacalcin in the past due to dyspepsia. She is currently on Vit D and calcium. She has severe scoliosis but denies pain to the area today.    She has progressive memory loss and had declined medication. Her last MMSE was 24/30 on 01/26/14.     She had hemoccults in July x2 which were positive but this was thought at the time to be due to bleeding hemorrhoids.  Her Hgb continues to trend downward to 9.8 in July.  There are no reports of bleeding since then. The resident denies pain to the area or constipation.   The staff reports that their records intermittently indicate that she is not having BMs regularly but this is difficult to assess as she takes her self to the bathroom.  Review of Systems:  Review of Systems  Constitutional: Negative for fever, chills, weight loss and malaise/fatigue.  Respiratory: Negative for cough and shortness of breath.   Cardiovascular: Negative for chest pain and leg swelling.  Gastrointestinal: Negative for heartburn, nausea, vomiting, abdominal pain, diarrhea, constipation and blood in stool.  Genitourinary: Negative for dysuria.  Musculoskeletal: Positive for falls. Negative for myalgias, back pain and joint pain.       Severe scoliosis  Skin: Negative for rash.  Neurological: Negative for  dizziness, tremors, focal weakness and loss of consciousness.  Psychiatric/Behavioral: Positive for memory loss. Negative for depression. The patient is not nervous/anxious.       Medications: Patient's Medications  New Prescriptions   No medications on file  Previous Medications   ACETAMINOPHEN (TYLENOL) 500 MG TABLET    Take 1 tablet (500 mg total) by mouth every 6 (six) hours as needed for mild pain.   ASPIRIN EC 81 MG EC TABLET    Take 1 tablet (81 mg total) by mouth daily.   CHOLECALCIFEROL (VITAMIN D) 2000 UNITS CAPS    Take 1 capsule (2,000 Units total) by mouth daily.   OMEPRAZOLE (PRILOSEC) 20 MG CAPSULE    Take 1 capsule (20 mg total) by mouth daily.   RANITIDINE (ZANTAC) 150 MG TABLET    Take 150 mg by mouth daily as needed for heartburn.   SENNA (SENOKOT) 8.6 MG TABS TABLET    Take 1 tablet by mouth 2 (two) times daily.  Modified Medications   No medications on file  Discontinued Medications   RANITIDINE (ZANTAC) 150 MG CAPSULE    Take 150 mg by mouth daily as needed.    Physical Exam: Filed Vitals:   09/23/14 1034  BP: 151/80  Pulse: 81  Temp: 96.9 F (36.1 C)  Resp: 17  Weight: 110 lb 9.6 oz (50.168 kg)  SpO2: 95%   Wt Readings from Last 3 Encounters:  09/23/14 110 lb 9.6 oz (50.168 kg)  08/03/14 109 lb (  49.442 kg)  07/23/14 108 lb 12.8 oz (49.351 kg)     Physical Exam  Constitutional: No distress.  [name], thin  Neck: No JVD present. No tracheal deviation present. No thyromegaly present.  Cardiovascular: Normal rate and regular rhythm.   Trace edema bilaterally. Varicosities noted to both feet/ankles  Pulmonary/Chest: Effort normal and breath sounds normal. No respiratory distress.  Decreased bases  Abdominal: Soft. Bowel sounds are normal. She exhibits no distension. There is no tenderness.  Musculoskeletal: She exhibits no edema or tenderness.  No spinous process tenderness  Lymphadenopathy:    She has no cervical adenopathy.  Neurological: She is  alert. No cranial nerve deficit.  Oriented x 2. Able to f/c and answer q's  Skin: She is not diaphoretic.      Labs reviewed: Basic Metabolic Panel:  Recent Labs  01/19/14 0244 01/19/14 0254 02/22/14 04/21/14 08/05/14  NA 135 137 134* 137 141  K 3.6 3.8 4.2 3.8 4.0  CL 101 100  --   --   --   CO2 26  --   --   --   --   GLUCOSE 173* 176*  --   --   --   BUN 22 22 28* 24* 16  CREATININE 1.22* 1.30* 1.1 1.0 1.2*  CALCIUM 9.3  --   --   --   --     Liver Function Tests:  Recent Labs  01/19/14 0244 08/05/14  AST 45* 20  ALT 35 20  ALKPHOS 99 46  BILITOT 0.6  --   PROT 7.0  --   ALBUMIN 3.7  --     CBC:  Recent Labs  01/19/14 0244  02/22/14 04/21/14 08/05/14  WBC 13.4*  --  8.7 5.7 5.3  NEUTROABS 11.8*  --   --   --   --   HGB 12.6  < > 11.1* 10.1* 9.8*  HCT 38.5  < > 34* 31* 29*  MCV 91.0  --   --   --   --   PLT 364  --  385 441* 407*  < > = values in this interval not displayed. 06/01/14 iron 41, TIBC 323, %sat 11, ferritin 11  Assessment/Plan  1. Memory loss -stable function and cognitive status -Does not wish to go on meds for this issue -continue supportive care and participation in activities  2. Anemia, unspecified anemia type -recheck CBC and iron studies and hold asa -no further reports of bleeding hemorrhoids  3. Senile osteoporosis -resident can not tolerate bisphosphonate -family declined BMD -currently not on calcium due to constipation, continue Vit D  4. Slow transit constipation -refusing senna in the evening but taking in the am -not really sure if she is having BMs regularly -will increase am dose to 10cc  5. Scoliosis (and kyphoscoliosis), idiopathic -denies pain -completed PT -continue scheduled Tylenol     [name], ANP [facility] [phone]

## 2014-10-25 ENCOUNTER — Non-Acute Institutional Stay (SKILLED_NURSING_FACILITY): Payer: Medicare Other | Admitting: Adult Health

## 2014-10-25 ENCOUNTER — Encounter: Payer: Self-pay | Admitting: Adult Health

## 2014-10-25 DIAGNOSIS — R413 Other amnesia: Secondary | ICD-10-CM

## 2014-10-25 DIAGNOSIS — K219 Gastro-esophageal reflux disease without esophagitis: Secondary | ICD-10-CM | POA: Diagnosis not present

## 2014-10-25 DIAGNOSIS — D649 Anemia, unspecified: Secondary | ICD-10-CM | POA: Diagnosis not present

## 2014-10-25 DIAGNOSIS — K5901 Slow transit constipation: Secondary | ICD-10-CM | POA: Diagnosis not present

## 2014-10-25 NOTE — Progress Notes (Signed)
Patient ID: Tanya Leblanc, female   DOB: 1924-08-16, 81 y.o.   MRN: [id number]  Location:  [facility]  Code status DNR  Chief Complaint  Patient presents with  . Medical Management of Chronic Issues   HPI:  81 y.o.female  residing at [facility], skilled care. I am here to review her chronic medical issues.  VS have been stable over the past month.  She has a hx of progressive memory loss, scoliosis,breast ca, melanoma,  possible TIA, GERD, and OP.  She currently is ambulatory with a walker.   She has a hx of OP and did not tolerate boniva, fosamax, actonel, or miacalcin in the past due to dyspepsia. She is currently on Vit D and calcium. She has severe scoliosis but denies pain to the area today.  Her family has declined dexa scan.   She had hemoccults in July x2 which were positive but this was thought at the time to be due to bleeding hemorrhoids.  Her Hgb continues to trend downward to 9.9 in Sept.  There are no reports of bleeding and the resident denies pain. Aspirin was stopped on 9/8 for this reason.  She has gained 6 lbs in the past month, if the weights are correct. She has no hx of CHF, denies CP or SOB. Echo in Jan of 2016 showed normal EF with mild diastolic dysfunction.     Review of Systems:  Review of Systems  Constitutional: Negative for fever, chills, weight loss and malaise/fatigue.  Respiratory: Negative for cough and shortness of breath.   Cardiovascular: Negative for chest pain and leg swelling.  Gastrointestinal: Negative for heartburn, nausea, vomiting, abdominal pain, diarrhea, constipation and blood in stool.  Genitourinary: Negative for dysuria.  Musculoskeletal: Positive for falls. Negative for myalgias, back pain and joint pain.       Severe scoliosis  Skin: Negative for rash.  Neurological: Negative for dizziness, tremors, focal weakness and loss of consciousness.  Psychiatric/Behavioral: Positive for memory loss.  Negative for depression. The patient is not nervous/anxious.       Medications: Patient's Medications  New Prescriptions   No medications on file  Previous Medications   ACETAMINOPHEN (TYLENOL) 500 MG TABLET    Take 1 tablet (500 mg total) by mouth every 6 (six) hours as needed for mild pain.   CHOLECALCIFEROL (VITAMIN D) 2000 UNITS CAPS    Take 1 capsule (2,000 Units total) by mouth daily.   OMEPRAZOLE (PRILOSEC) 20 MG CAPSULE    Take 1 capsule (20 mg total) by mouth daily.   RANITIDINE (ZANTAC) 150 MG TABLET    Take 150 mg by mouth daily as needed for heartburn.   SENNA (SENOKOT) 8.6 MG TABS TABLET    Take 1 tablet by mouth 2 (two) times daily.  Modified Medications   No medications on file  Discontinued Medications   ASPIRIN EC 81 MG EC TABLET    Take 1 tablet (81 mg total) by mouth daily.    Physical Exam: Filed Vitals:   10/25/14 1443  BP: 147/87  Pulse: 78  Temp: 97.6 F (36.4 C)  Resp: 16  Weight: 116 lb 3.2 oz (52.708 kg)   Wt Readings from Last 3 Encounters:  10/25/14 116 lb 3.2 oz (52.708 kg)  09/23/14 110 lb 9.6 oz (50.168 kg)  08/03/14 109 lb (49.442 kg)     Physical Exam  Constitutional: No distress.  [name], thin  Neck: No JVD present. No tracheal deviation present. No thyromegaly  present.  Cardiovascular: Normal rate and regular rhythm.   +1 edema bilaterally. Varicosities noted to both feet/ankles  Pulmonary/Chest: Effort normal. No respiratory distress.  Crackles to both bases  Abdominal: Soft. Bowel sounds are normal. She exhibits no distension. There is no tenderness.  Musculoskeletal: She exhibits no edema or tenderness.  No spinous process tenderness  Lymphadenopathy:    She has no cervical adenopathy.  Neurological: She is alert. No cranial nerve deficit.  Oriented x 2. Able to f/c and answer q's  Skin: She is not diaphoretic.      Labs reviewed: Basic Metabolic Panel:  Recent Labs  01/19/14 0244 01/19/14 0254 02/22/14 04/21/14  08/05/14  NA 135 137 134* 137 141  K 3.6 3.8 4.2 3.8 4.0  CL 101 100  --   --   --   CO2 26  --   --   --   --   GLUCOSE 173* 176*  --   --   --   BUN 22 22 28* 24* 16  CREATININE 1.22* 1.30* 1.1 1.0 1.2*  CALCIUM 9.3  --   --   --   --     Liver Function Tests:  Recent Labs  01/19/14 0244 08/05/14  AST 45* 20  ALT 35 20  ALKPHOS 99 46  BILITOT 0.6  --   PROT 7.0  --   ALBUMIN 3.7  --     CBC:  Recent Labs  01/19/14 0244  04/21/14 08/05/14 09/23/14  WBC 13.4*  < > 5.7 5.3 6.5  NEUTROABS 11.8*  --   --   --   --   HGB 12.6  < > 10.1* 9.8* 9.9*  HCT 38.5  < > 31* 29* 31*  MCV 91.0  --   --   --   --   PLT 364  < > 441* 407* 534*  < > = values in this interval not displayed. 06/01/14 iron 41, TIBC 323, %sat 11, ferritin 11 09/23/14 iron 38, TIBC 424, %sat 9, ferritin 18  Assessment/Plan  1. Memory loss -stable function and cognitive status -Does not wish to go on meds for this issue -continue supportive care and participation in activities  2. Anemia, unspecified anemia type -normocytic, normochromic -slow decline over time, off aspirin  -recheck stools for hemoccult -iron slightly low but has constipation so avoid supplementation now  3. GERD -no complaints, continue current meds and await hemoccults  4. Constipation -continue senna s   [name], [place] [phone]

## 2014-12-02 ENCOUNTER — Non-Acute Institutional Stay (SKILLED_NURSING_FACILITY): Payer: Medicare Other | Admitting: Adult Health

## 2014-12-02 DIAGNOSIS — R413 Other amnesia: Secondary | ICD-10-CM | POA: Diagnosis not present

## 2014-12-02 DIAGNOSIS — M810 Age-related osteoporosis without current pathological fracture: Secondary | ICD-10-CM | POA: Diagnosis not present

## 2014-12-02 DIAGNOSIS — D649 Anemia, unspecified: Secondary | ICD-10-CM | POA: Diagnosis not present

## 2014-12-06 ENCOUNTER — Encounter: Payer: Self-pay | Admitting: Adult Health

## 2014-12-06 NOTE — Progress Notes (Signed)
Patient ID: Tanya Leblanc, female   DOB: 1924/04/30, 81 y.o.   MRN: [id number]  Location:  [facility]  Code status DNR  Chief Complaint  Patient presents with  . Medical Management of Chronic Issues   HPI:  81 y.o.female  residing at [facility], skilled care. I am here to review her chronic medical issues.  VS have been stable over the past month.  She has a hx of progressive memory loss, scoliosis, h/o breast ca, h/o melanoma,  possible TIA, GERD, and OP.  She currently is ambulatory with a walker.   She has a hx of OP and did not tolerate boniva, fosamax, actonel, or miacalcin in the past due to dyspepsia. She is currently on Vit D and calcium. She has severe scoliosis but denies pain to the area today.  Her family has declined dexa scan or medication.   She had hemoccults in July x2 which were positive but this was thought at the time to be due to bleeding hemorrhoids.  Her Hgb 9.9 in Sept 2016. Aspirin was stopped in Sept as well. There are no reports of further bleeding and subsequent hemoccults have been neg x 2.       Review of Systems:  Review of Systems  Constitutional: Negative for fever, chills, weight loss and malaise/fatigue.  Respiratory: Negative for cough and shortness of breath.   Cardiovascular: Negative for chest pain and leg swelling.  Gastrointestinal: Negative for heartburn, nausea, vomiting, abdominal pain, diarrhea, constipation and blood in stool.  Genitourinary: Negative for dysuria.  Musculoskeletal: Positive for falls. Negative for myalgias, back pain and joint pain.       Severe scoliosis  Skin: Negative for rash.  Neurological: Negative for dizziness, tremors, focal weakness and loss of consciousness.  Psychiatric/Behavioral: Positive for memory loss. Negative for depression. The patient is not nervous/anxious.       Medications: Patient's Medications  New Prescriptions   No medications on file  Previous  Medications   ACETAMINOPHEN (TYLENOL) 500 MG TABLET    Take 1 tablet (500 mg total) by mouth every 6 (six) hours as needed for mild pain.   CHOLECALCIFEROL (VITAMIN D) 2000 UNITS CAPS    Take 1 capsule (2,000 Units total) by mouth daily.   OMEPRAZOLE (PRILOSEC) 20 MG CAPSULE    Take 1 capsule (20 mg total) by mouth daily.   RANITIDINE (ZANTAC) 150 MG TABLET    Take 150 mg by mouth daily as needed for heartburn.   SENNA (SENOKOT) 8.6 MG TABS TABLET    Take 1 tablet by mouth 2 (two) times daily.  Modified Medications   No medications on file  Discontinued Medications   No medications on file    Physical Exam: Filed Vitals:   12/06/14 1426  BP: 145/81  Pulse: 75  Temp: 97 F (36.1 C)  Resp: 18  Weight: 115 lb 6.4 oz (52.345 kg)  SpO2: 97%   Wt Readings from Last 3 Encounters:  12/06/14 115 lb 6.4 oz (52.345 kg)  10/25/14 116 lb 3.2 oz (52.708 kg)  09/23/14 110 lb 9.6 oz (50.168 kg)     Physical Exam  Constitutional: No distress.  [name], thin  Neck: No JVD present. No tracheal deviation present. No thyromegaly present.  Cardiovascular: Normal rate and regular rhythm.   +1 edema bilaterally. Varicosities noted to both feet/ankles  Pulmonary/Chest: Effort normal and breath sounds normal. No respiratory distress.  Abdominal: Soft. Bowel sounds are normal. She exhibits no distension. There  is no tenderness.  Musculoskeletal: She exhibits no edema or tenderness.  No spinous process tenderness, severe scoliosis  Lymphadenopathy:    She has no cervical adenopathy.  Neurological: She is alert. No cranial nerve deficit.  Oriented x 2. Able to f/c and answer q's  Skin: She is not diaphoretic.  Psychiatric: Affect normal.      Labs reviewed: Basic Metabolic Panel:  Recent Labs  01/19/14 0244 01/19/14 0254 02/22/14 04/21/14 08/05/14  NA 135 137 134* 137 141  K 3.6 3.8 4.2 3.8 4.0  CL 101 100  --   --   --   CO2 26  --   --   --   --   GLUCOSE 173* 176*  --   --   --     BUN 22 22 28* 24* 16  CREATININE 1.22* 1.30* 1.1 1.0 1.2*  CALCIUM 9.3  --   --   --   --     Liver Function Tests:  Recent Labs  01/19/14 0244 08/05/14  AST 45* 20  ALT 35 20  ALKPHOS 99 46  BILITOT 0.6  --   PROT 7.0  --   ALBUMIN 3.7  --     CBC:  Recent Labs  01/19/14 0244  04/21/14 08/05/14 09/23/14  WBC 13.4*  < > 5.7 5.3 6.5  NEUTROABS 11.8*  --   --   --   --   HGB 12.6  < > 10.1* 9.8* 9.9*  HCT 38.5  < > 31* 29* 31*  MCV 91.0  --   --   --   --   PLT 364  < > 441* 407* 534*  < > = values in this interval not displayed. 06/01/14 iron 41, TIBC 323, %sat 11, ferritin 11 09/23/14 iron 38, TIBC 424, %sat 9, ferritin 18  Assessment/Plan  1. Memory loss -stable function and cognitive status -Does not wish to go on meds for this issue -continue supportive care and participation in activities  2. Anemia, unspecified anemia type -normocytic, normochromic -slow decline over time, off aspirin  -stools neg for blood -avoid iron unless worsens due to constipation  3. Osteoporosis -continues on Vit D, not on Ca due to constipation -no fractures or pain at this time -no intervention per family request   [name], [place] [phone]

## 2014-12-30 ENCOUNTER — Non-Acute Institutional Stay (SKILLED_NURSING_FACILITY): Payer: Medicare Other | Admitting: Adult Health

## 2014-12-30 ENCOUNTER — Encounter: Payer: Self-pay | Admitting: Adult Health

## 2014-12-30 DIAGNOSIS — M4120 Other idiopathic scoliosis, site unspecified: Secondary | ICD-10-CM | POA: Diagnosis not present

## 2014-12-30 DIAGNOSIS — D649 Anemia, unspecified: Secondary | ICD-10-CM | POA: Diagnosis not present

## 2014-12-30 DIAGNOSIS — R413 Other amnesia: Secondary | ICD-10-CM

## 2014-12-30 DIAGNOSIS — K5901 Slow transit constipation: Secondary | ICD-10-CM

## 2014-12-30 NOTE — Progress Notes (Signed)
Patient ID: Tanya Leblanc, female   DOB: November 11, 1924, 81 y.o.   MRN: [id number]  Location:  [facility]  Code status DNR  Patient Care Team: Tanya Curry, DO as PCP - General (Geriatric Medicine)   Chief Complaint  Patient presents with  . Medical Management of Chronic Issues   HPI:  81 y.o.female  residing at [facility], skilled care. I am here to review her chronic medical issues.  VS have been stable over the past month.  She has a hx of progressive memory loss, scoliosis, h/o breast ca, h/o melanoma,  possible TIA, GERD, and OP.  She currently is ambulatory with a walker.  She has a hx of frequent falls and fell on 11/25 without subsequent injury. She was in the BR at the time and did not call for assistance. She has a bed and chair alarm to help prevent this issue.  She uses a walker for mobility  She had hemoccults in July x2 which were positive but this was thought at the time to be due to bleeding hemorrhoids.  Her Hgb 9.9 in Sept 2016. Aspirin was stopped in Sept as well. There are no reports of further bleeding and subsequent hemoccults have been neg x 2.   Her weight has been stable over the past few months. She consumes boost daily with senna for constipation.  She denies any issues with constipation. Her last MMSE on 01/26/14 revealed a score of 24/30, doing poorly in orientation and recall.   Review of Systems:  Review of Systems  Constitutional: Negative for fever, chills, weight loss and malaise/fatigue.  Respiratory: Negative for cough and shortness of breath.   Cardiovascular: Negative for chest pain and leg swelling.  Gastrointestinal: Negative for heartburn, nausea, vomiting, abdominal pain, diarrhea, constipation and blood in stool.  Genitourinary: Negative for dysuria.  Musculoskeletal: Positive for falls. Negative for myalgias, back pain and joint pain.       Severe scoliosis  Skin: Negative for rash.  Neurological: Negative  for dizziness, tremors, focal weakness and loss of consciousness.  Psychiatric/Behavioral: Positive for memory loss. Negative for depression. The patient is not nervous/anxious.       Medications: Patient's Medications  New Prescriptions   No medications on file  Previous Medications   ACETAMINOPHEN (TYLENOL) 500 MG TABLET    Take 1 tablet (500 mg total) by mouth every 6 (six) hours as needed for mild pain.   CHOLECALCIFEROL (VITAMIN D) 2000 UNITS CAPS    Take 1 capsule (2,000 Units total) by mouth daily.   OMEPRAZOLE (PRILOSEC) 20 MG CAPSULE    Take 1 capsule (20 mg total) by mouth daily.   RANITIDINE (ZANTAC) 150 MG TABLET    Take 150 mg by mouth daily as needed for heartburn.   SENNA (SENOKOT) 8.6 MG TABS TABLET    Take 1 tablet by mouth 2 (two) times daily.  Modified Medications   No medications on file  Discontinued Medications   No medications on file    Physical Exam: Filed Vitals:   12/30/14 1049  BP: 135/77  Pulse: 80  Temp: 98.4 F (36.9 C)  Resp: 17  Weight: 118 lb 9.6 oz (53.797 kg)   Wt Readings from Last 3 Encounters:  12/30/14 118 lb 9.6 oz (53.797 kg)  12/06/14 115 lb 6.4 oz (52.345 kg)  10/25/14 116 lb 3.2 oz (52.708 kg)     Physical Exam  Constitutional: No distress.  Tanya Leblanc, thin  HENT:  Head: Normocephalic  and atraumatic.  Neck: No JVD present. No tracheal deviation present. No thyromegaly present.  Cardiovascular: Normal rate and regular rhythm.   Trace edema to ankles. Varicosities noted to both feet/ankles  Pulmonary/Chest: Effort normal and breath sounds normal. No respiratory distress.  Abdominal: Soft. Bowel sounds are normal. She exhibits no distension. There is no tenderness.  Musculoskeletal: She exhibits no edema or tenderness.  No spinous process tenderness, severe scoliosis  Lymphadenopathy:    She has no cervical adenopathy.  Neurological: She is alert. No cranial nerve deficit.  Oriented x 2. Able to f/c and answer q's  Skin:  Skin is warm and dry. She is not diaphoretic.  Psychiatric: Affect normal.      Labs reviewed: Basic Metabolic Panel:  Recent Labs  01/19/14 0244 01/19/14 0254 02/22/14 04/21/14 08/05/14  NA 135 137 134* 137 141  K 3.6 3.8 4.2 3.8 4.0  CL 101 100  --   --   --   CO2 26  --   --   --   --   GLUCOSE 173* 176*  --   --   --   BUN 22 22 28* 24* 16  CREATININE 1.22* 1.30* 1.1 1.0 1.2*  CALCIUM 9.3  --   --   --   --     Liver Function Tests:  Recent Labs  01/19/14 0244 08/05/14  AST 45* 20  ALT 35 20  ALKPHOS 99 46  BILITOT 0.6  --   PROT 7.0  --   ALBUMIN 3.7  --     CBC:  Recent Labs  01/19/14 0244  04/21/14 08/05/14 09/23/14  WBC 13.4*  < > 5.7 5.3 6.5  NEUTROABS 11.8*  --   --   --   --   HGB 12.6  < > 10.1* 9.8* 9.9*  HCT 38.5  < > 31* 29* 31*  MCV 91.0  --   --   --   --   PLT 364  < > 441* 407* 534*  < > = values in this interval not displayed. 06/01/14 iron 41, TIBC 323, %sat 11, ferritin 11 09/23/14 iron 38, TIBC 424, %sat 9, ferritin 18  Assessment/Plan  1. Slow transit constipation -controlled, continue senna  2. Scoliosis (and kyphoscoliosis), idiopathic -denies pain, uses walker to help with ambulation and reduce falls  3. Anemia, unspecified anemia type -f/u CBC next draw -neg stools, probably ACD  4. Memory loss -stable function and cognition but will need an annual MMSE in Jan to reassess -resident and family have declined the use of meds for this issue   Tanya Leblanc, [place] [phone]

## 2015-01-04 LAB — BASIC METABOLIC PANEL
BUN: 21 mg/dL (ref 4–21)
Creatinine: 1 mg/dL (ref 0.5–1.1)
Glucose: 94 mg/dL
Potassium: 4.2 mmol/L (ref 3.4–5.3)
Sodium: 140 mmol/L (ref 137–147)

## 2015-01-04 LAB — CBC AND DIFFERENTIAL
HEMATOCRIT: 28 % — AB (ref 36–46)
HEMOGLOBIN: 9.2 g/dL — AB (ref 12.0–16.0)
PLATELETS: 434 10*3/uL — AB (ref 150–399)
WBC: 5.8 10^3/mL

## 2015-01-31 ENCOUNTER — Non-Acute Institutional Stay (SKILLED_NURSING_FACILITY): Payer: Medicare Other | Admitting: Adult Health

## 2015-01-31 DIAGNOSIS — G309 Alzheimer's disease, unspecified: Secondary | ICD-10-CM | POA: Diagnosis not present

## 2015-01-31 DIAGNOSIS — F0280 Dementia in other diseases classified elsewhere without behavioral disturbance: Secondary | ICD-10-CM | POA: Diagnosis not present

## 2015-01-31 DIAGNOSIS — K219 Gastro-esophageal reflux disease without esophagitis: Secondary | ICD-10-CM

## 2015-01-31 DIAGNOSIS — D649 Anemia, unspecified: Secondary | ICD-10-CM

## 2015-01-31 DIAGNOSIS — M4120 Other idiopathic scoliosis, site unspecified: Secondary | ICD-10-CM

## 2015-02-03 ENCOUNTER — Encounter: Payer: Self-pay | Admitting: Adult Health

## 2015-02-03 DIAGNOSIS — G309 Alzheimer's disease, unspecified: Secondary | ICD-10-CM

## 2015-02-03 DIAGNOSIS — F0280 Dementia in other diseases classified elsewhere without behavioral disturbance: Secondary | ICD-10-CM | POA: Insufficient documentation

## 2015-02-03 NOTE — Progress Notes (Signed)
Patient ID: Tanya Leblanc, female   DOB: Jan 27, 1924, 82 y.o.   MRN: [id number]  Location:  [facility] Provider:  Royal Hawthorn, NP   Code Status:  DNR Goals of care: Advanced Directive information Advanced Directives 08/10/2014  Does patient have an advance directive? Yes  Type of Paramedic of [place];Out of facility DNR (pink MOST or yellow form);Living will  Does patient want to make changes to advanced directive? -  Copy of advanced directive(s) in chart? No - copy requested  Pre-existing out of facility DNR order (yellow form or pink MOST form) Yellow form placed in chart (order not valid for inpatient use)     Chief Complaint  Patient presents with  . Medical Management of Chronic Issues    HPI:  Pt is a 82 y.o. female seen today for medical management of chronic diseases.  Resident has a hx of dementia, anemia, and significant scoliosis. Remains ambulatory and verbal. Last MMSE 24/30 on 01/26/14 with a failed clock, poor recall and orientation. Resident has a normochromic, normocytic anemia with stable H/H, heme neg stools. Not currently on meds for this issue. Has a hx of back pain due to scoliosis and OA but denies pain today.  Her daughter/POA does not want to implement any new meds, aggressive testing, etc, unless it will improve her quality of life and comfort.  Functional status: ambulatory with a walker  Review of Systems  Constitutional: Negative for fever, chills, weight loss, malaise/fatigue and diaphoresis.  HENT: Negative for congestion.   Respiratory: Negative for cough and shortness of breath.   Cardiovascular: Positive for leg swelling. Negative for chest pain, palpitations and orthopnea.  Gastrointestinal: Negative for vomiting, abdominal pain and diarrhea.  Genitourinary: Negative for dysuria.  Musculoskeletal: Positive for falls. Negative for myalgias, back pain, joint pain and neck pain.  Skin:  Negative for rash.  Neurological: Negative for dizziness, speech change, focal weakness and weakness.  Psychiatric/Behavioral: Positive for memory loss. Negative for depression, suicidal ideas, hallucinations and substance abuse. The patient is not nervous/anxious.     Past Medical History  Diagnosis Date  . Scoliosis   . Low back pain   . Chronic kidney disease   . Amnesia   . Cancer ([place])   . Breast CA ([place])   . Melanoma ([place])   . GERD (gastroesophageal reflux disease)   . Osteoporosis    Past Surgical History  Procedure Laterality Date  . Mastectomy Left 1990  . Breast surgery      reconstructive  . Cataract extraction Bilateral 2011    Dr. Kathrin Penner  . Melanoma excision      back, fore arm     Allergies  Allergen Reactions  . Actonel [Risedronate] Other (See Comments)    Per patients mar  . Boniva [Ibandronic Acid] Other (See Comments)    Per patiens mar  . Fosamax [Alendronate] Other (See Comments)    Per patiens mar  . Miacalcin [Calcitonin] Other (See Comments)    Per patients mar  . Sulfa Antibiotics Itching      Medication List       This list is accurate as of: 01/31/15 11:59 PM.  Always use your most recent med list.               acetaminophen 500 MG tablet  Commonly known as:  TYLENOL  Take 1 tablet (500 mg total) by mouth every 6 (six) hours as needed for mild pain.  omeprazole 20 MG capsule  Commonly known as:  PRILOSEC  Take 1 capsule (20 mg total) by mouth daily.     ranitidine 150 MG tablet  Commonly known as:  ZANTAC  Take 150 mg by mouth daily as needed for heartburn.     sennosides 8.8 MG/5ML syrup  Commonly known as:  SENOKOT  Take 10 mLs by mouth at bedtime.     Vitamin D 2000 units Caps  Take 1 capsule (2,000 Units total) by mouth daily.        Immunization History  Administered Date(s) Administered  . Influenza-Unspecified 10/28/2014   Pertinent  Health Maintenance Due  Topic Date Due  . DEXA SCAN  10/15/1991    . PNA vac Low Risk Adult (1 of 2 - PCV13) 10/15/1991  . INFLUENZA VACCINE  08/16/2015   Fall Risk  12/30/2014 05/27/2014  Falls in the past year? Yes Yes  Number falls in past yr: 2 or more 2 or more  Injury with Fall? No No  Risk Factor Category  High Fall Risk High Fall Risk  Risk for fall due to : History of fall(s);Impaired balance/gait History of fall(s)  Follow up Falls evaluation completed Falls evaluation completed    Filed Vitals:   02/03/15 1638  BP: 147/82  Pulse: 78  Temp: 98.9 F (37.2 C)  Resp: 16  Weight: 118 lb 6.4 oz (53.706 kg)  SpO2: 97%   Wt Readings from Last 3 Encounters:  02/03/15 118 lb 6.4 oz (53.706 kg)  12/30/14 118 lb 9.6 oz (53.797 kg)  12/06/14 115 lb 6.4 oz (52.345 kg)    Physical Exam  Constitutional: No distress.  HENT:  Head: Normocephalic and atraumatic.  Cardiovascular: Normal rate and regular rhythm.   No murmur heard. +1 edema to both ankles and feet  Pulmonary/Chest: Effort normal and breath sounds normal. No respiratory distress.  Abdominal: Soft. Bowel sounds are normal. She exhibits no distension.  Musculoskeletal: She exhibits no edema or tenderness.  Significant scoliosis  Neurological: She is alert. No cranial nerve deficit.  Oriented to self and situation  Skin: Skin is warm and dry. She is not diaphoretic. No erythema. No pallor.  Psychiatric: She has a normal mood and affect.    Labs reviewed:  Recent Labs  04/21/14 08/05/14 01/04/15  NA 137 141 140  K 3.8 4.0 4.2  BUN 24* 16 21  CREATININE 1.0 1.2* 1.0    Recent Labs  08/05/14  AST 20  ALT 20  ALKPHOS 46    Recent Labs  08/05/14 09/23/14 01/04/15  WBC 5.3 6.5 5.8  HGB 9.8* 9.9* 9.2*  HCT 29* 31* 28*  PLT 407* 534* 434*   No results found for: TSH Lab Results  Component Value Date   HGBA1C 5.8* 01/19/2014   Lab Results  Component Value Date   CHOL 186 02/22/2014   HDL 57 02/22/2014   LDLCALC 112 02/22/2014   TRIG 86 02/22/2014   CHOLHDL  4.0 01/19/2014    Significant Diagnostic Results in last 30 days:  No results found.  Assessment/Plan 1. Anemia, unspecified anemia type -fairly stable -would not use iron in this setting due to resident hx of constipation and iron levels are borderline -continue to monitor and avoid ASA  2. Alzheimer's dementia without behavioral disturbance, unspecified timing of dementia onset -needs another MMSE to assess cognitive status -not currently on meds due to family/resident wishes -would only implement medications if she developed behavioral issues  3. Scoliosis (and kyphoscoliosis), idiopathic -  denies pain  -continue walker use and tylenol prn  4. Gastroesophageal reflux disease, esophagitis presence not specified -denies reflux today but apparently had significant symptoms in the past -continue current meds    Family/ staff Communication: discussed with nurse  Labs/tests ordered: none  [name], [place] [phone]

## 2015-03-03 ENCOUNTER — Encounter: Payer: Self-pay | Admitting: Adult Health

## 2015-03-03 ENCOUNTER — Non-Acute Institutional Stay (SKILLED_NURSING_FACILITY): Payer: Medicare Other | Admitting: Adult Health

## 2015-03-03 DIAGNOSIS — K219 Gastro-esophageal reflux disease without esophagitis: Secondary | ICD-10-CM | POA: Diagnosis not present

## 2015-03-03 DIAGNOSIS — G309 Alzheimer's disease, unspecified: Secondary | ICD-10-CM | POA: Diagnosis not present

## 2015-03-03 DIAGNOSIS — M4120 Other idiopathic scoliosis, site unspecified: Secondary | ICD-10-CM

## 2015-03-03 DIAGNOSIS — M810 Age-related osteoporosis without current pathological fracture: Secondary | ICD-10-CM | POA: Diagnosis not present

## 2015-03-03 DIAGNOSIS — F0280 Dementia in other diseases classified elsewhere without behavioral disturbance: Secondary | ICD-10-CM

## 2015-03-03 NOTE — Progress Notes (Signed)
Patient ID: Tanya Leblanc, female   DOB: 19-Nov-1924, 82 y.o.   MRN: [id number]  Location:  [facility] Provider:  Royal Hawthorn, NP   Code Status:  DNR Goals of care: Advanced Directive information Advanced Directives 08/10/2014  Does patient have an advance directive? Yes  Type of Paramedic of [employer];Out of facility DNR (pink MOST or yellow form);Living will  Copy of advanced directive(s) in chart? No - copy requested  Pre-existing out of facility DNR order (yellow form or pink MOST form) Yellow form placed in chart (order not valid for inpatient use)     Chief Complaint  Patient presents with  . Medical Management of Chronic Issues    HPI:  Pt is a 82 y.o. female seen today for medical management of chronic diseases.  Resident has a hx of dementia, anemia, and significant scoliosis. Remains ambulatory and verbal. Last MMSE 19/30 on 06/18/14 with a failed clock, poor recall, attention,calculation, and orientation. Resident has a normochromic, normocytic anemia with stable H/H, heme neg stools. Not currently on meds for this issue. Has a hx of back pain due to scoliosis and OA but denies pain today.  Her daughter/POA does not want to implement any new meds, aggressive testing, etc, unless it will improve her quality of life and comfort. Currently on prilosec for gerd, denies indigestion and has not used prn zantac.  Functional status: ambulatory with a walker  Review of Systems  Constitutional: Negative for fever, chills, weight loss, malaise/fatigue and diaphoresis.  HENT: Negative for congestion.   Respiratory: Negative for cough and shortness of breath.   Cardiovascular: Positive for leg swelling. Negative for chest pain, palpitations and orthopnea.  Gastrointestinal: Negative for vomiting, abdominal pain and diarrhea.  Genitourinary: Negative for dysuria.  Musculoskeletal: Positive for falls. Negative for myalgias, back  pain, joint pain and neck pain.  Skin: Negative for rash.  Neurological: Negative for dizziness, speech change, focal weakness and weakness.  Psychiatric/Behavioral: Positive for memory loss. Negative for depression, suicidal ideas, hallucinations and substance abuse. The patient is not nervous/anxious.     Past Medical History  Diagnosis Date  . Scoliosis   . Low back pain   . Chronic kidney disease   . Amnesia   . Cancer ([place])   . Breast CA ([place])   . Melanoma ([place])   . GERD (gastroesophageal reflux disease)   . Osteoporosis    Past Surgical History  Procedure Laterality Date  . Mastectomy Left 1990  . Breast surgery      reconstructive  . Cataract extraction Bilateral 2011    Dr. Kathrin Penner  . Melanoma excision      back, fore arm     Allergies  Allergen Reactions  . Actonel [Risedronate] Other (See Comments)    Per patients mar  . Boniva [Ibandronic Acid] Other (See Comments)    Per patiens mar  . Fosamax [Alendronate] Other (See Comments)    Per patiens mar  . Miacalcin [Calcitonin] Other (See Comments)    Per patients mar  . Sulfa Antibiotics Itching      Medication List       This list is accurate as of: 03/03/15  2:55 PM.  Always use your most recent med list.               acetaminophen 500 MG tablet  Commonly known as:  TYLENOL  Take 1 tablet (500 mg total) by mouth every 6 (six) hours as needed for mild pain.  omeprazole 20 MG capsule  Commonly known as:  PRILOSEC  Take 1 capsule (20 mg total) by mouth daily.     ranitidine 150 MG tablet  Commonly known as:  ZANTAC  Take 150 mg by mouth daily as needed for heartburn.     sennosides 8.8 MG/5ML syrup  Commonly known as:  SENOKOT  Take 10 mLs by mouth at bedtime.     Vitamin D 2000 units Caps  Take 1 capsule (2,000 Units total) by mouth daily.        Immunization History  Administered Date(s) Administered  . Influenza-Unspecified 10/28/2014   Pertinent  Health Maintenance Due    Topic Date Due  . DEXA SCAN  10/15/1991  . PNA vac Low Risk Adult (1 of 2 - PCV13) 10/15/1991  . INFLUENZA VACCINE  08/16/2015   Fall Risk  12/30/2014 05/27/2014  Falls in the past year? Yes Yes  Number falls in past yr: 2 or more 2 or more  Injury with Fall? No No  Risk Factor Category  High Fall Risk High Fall Risk  Risk for fall due to : History of fall(s);Impaired balance/gait History of fall(s)  Follow up Falls evaluation completed Falls evaluation completed    Filed Vitals:   03/03/15 1447  Weight: 117 lb 12.8 oz (53.434 kg)   Wt Readings from Last 3 Encounters:  03/03/15 117 lb 12.8 oz (53.434 kg)  02/03/15 118 lb 6.4 oz (53.706 kg)  12/30/14 118 lb 9.6 oz (53.797 kg)    Physical Exam  Constitutional: No distress.  HENT:  Head: Normocephalic and atraumatic.  Cardiovascular: Normal rate and regular rhythm.   No murmur heard. +1 edema to both ankles and feet  Pulmonary/Chest: Effort normal and breath sounds normal. No respiratory distress.  Abdominal: Soft. Bowel sounds are normal. She exhibits no distension.  Musculoskeletal: She exhibits no edema or tenderness.  Significant scoliosis  Neurological: She is alert. No cranial nerve deficit.  Oriented to self and situation  Skin: Skin is warm and dry. She is not diaphoretic. No erythema. No pallor.  Psychiatric: She has a normal mood and affect.    Labs reviewed:  Recent Labs  04/21/14 08/05/14 01/04/15  NA 137 141 140  K 3.8 4.0 4.2  BUN 24* 16 21  CREATININE 1.0 1.2* 1.0    Recent Labs  08/05/14  AST 20  ALT 20  ALKPHOS 46    Recent Labs  08/05/14 09/23/14 01/04/15  WBC 5.3 6.5 5.8  HGB 9.8* 9.9* 9.2*  HCT 29* 31* 28*  PLT 407* 534* 434*   No results found for: TSH Lab Results  Component Value Date   HGBA1C 5.8* 01/19/2014   Lab Results  Component Value Date   CHOL 186 02/22/2014   HDL 57 02/22/2014   LDLCALC 112 02/22/2014   TRIG 86 02/22/2014   CHOLHDL 4.0 01/19/2014     Significant Diagnostic Results in last 30 days:  No results found.  Assessment/Plan  1. Alzheimer's dementia without behavioral disturbance, unspecified timing of dementia onset -progressive decline in MMSE scores, stable functional status -continue supportive care, no meds per family/resident wishes  2. Gastroesophageal reflux disease, esophagitis presence not specified -stable -continue prilosec  3. Senile osteoporosis -most likely present but family declined DEXA scan or meds due to resident age/debility -remains on vit d  4. Scoliosis (and kyphoscoliosis), idiopathic -uses a walker for stability with gait -denies pain to this area   Family/ staff Communication: discussed with nurse  Labs/tests ordered:  none  [name], [place] [phone]

## 2015-04-15 ENCOUNTER — Non-Acute Institutional Stay (SKILLED_NURSING_FACILITY): Payer: Medicare Other | Admitting: Adult Health

## 2015-04-15 DIAGNOSIS — K5901 Slow transit constipation: Secondary | ICD-10-CM

## 2015-04-15 DIAGNOSIS — M4120 Other idiopathic scoliosis, site unspecified: Secondary | ICD-10-CM | POA: Diagnosis not present

## 2015-04-15 DIAGNOSIS — G309 Alzheimer's disease, unspecified: Secondary | ICD-10-CM

## 2015-04-15 DIAGNOSIS — F0280 Dementia in other diseases classified elsewhere without behavioral disturbance: Secondary | ICD-10-CM | POA: Diagnosis not present

## 2015-04-15 DIAGNOSIS — R269 Unspecified abnormalities of gait and mobility: Secondary | ICD-10-CM | POA: Diagnosis not present

## 2015-04-26 ENCOUNTER — Encounter: Payer: Self-pay | Admitting: Internal Medicine

## 2015-04-26 ENCOUNTER — Encounter: Payer: Self-pay | Admitting: Adult Health

## 2015-04-26 ENCOUNTER — Non-Acute Institutional Stay (SKILLED_NURSING_FACILITY): Payer: Medicare Other | Admitting: Internal Medicine

## 2015-04-26 DIAGNOSIS — F0280 Dementia in other diseases classified elsewhere without behavioral disturbance: Secondary | ICD-10-CM

## 2015-04-26 DIAGNOSIS — K5901 Slow transit constipation: Secondary | ICD-10-CM

## 2015-04-26 DIAGNOSIS — M4120 Other idiopathic scoliosis, site unspecified: Secondary | ICD-10-CM | POA: Diagnosis not present

## 2015-04-26 DIAGNOSIS — M810 Age-related osteoporosis without current pathological fracture: Secondary | ICD-10-CM

## 2015-04-26 DIAGNOSIS — G301 Alzheimer's disease with late onset: Secondary | ICD-10-CM

## 2015-04-26 DIAGNOSIS — K219 Gastro-esophageal reflux disease without esophagitis: Secondary | ICD-10-CM | POA: Diagnosis not present

## 2015-04-26 DIAGNOSIS — Z23 Encounter for immunization: Secondary | ICD-10-CM

## 2015-04-26 DIAGNOSIS — R531 Weakness: Secondary | ICD-10-CM | POA: Diagnosis not present

## 2015-04-26 NOTE — [facility] Notes (Signed)
Patient ID: Tanya Leblanc, female   DOB: Sep 15, 1924, 82 y.o.   MRN: [id number]  Location:  [facility] Room Number: 130 A Place of Service:  SNF (31) Provider:   Hollace Kinnier, DO  Patient Care Team: Gayland Curry, DO as PCP - General (Geriatric Medicine)  Extended Emergency Contact Information Primary Emergency Contact: Sherian Rein of [place] Phone: [phone] Mobile Phone: [phone] Relation: Daughter  Code Status:  DNR Goals of care: Advanced Directive information Advanced Directives 04/26/2015  Does patient have an advance directive? Yes  Type of Advance Directive Living will;Healthcare Power of [place];Out of facility DNR (pink MOST or yellow form)  Copy of advanced directive(s) in chart? Yes  Pre-existing out of facility DNR order (yellow form or pink MOST form) Yellow form placed in chart (order not valid for inpatient use)     Chief Complaint  Patient presents with  . Routine Visit in SNF    med mgt of chronic diseases    HPI:  Pt is a 82 y.o. female seen today for medical management of chronic diseases including senile osteoporosis, late onset Alzheimer's disease, GERD, constipation, hearing loss, right arm nerve compression, and severe protein calorie malnutrition.  She was sitting up in her recliner when I entered trying to get her tv operational.  Nursing reports that she vomited two days in a row after not having a bm for 3 days, but then had a large bm this am.  She did walk to lunch today with her walker.  She continues to lean to her left which has been ongoing with her severe kyphoscoliosis.  She has no recollection of any of these concerns and reports that she feels just fine.     Past Medical History  Diagnosis Date  . Scoliosis   . Low back pain   . Chronic kidney disease   . Amnesia   . Cancer ([place])   . Breast CA ([place])   . Melanoma ([place])   . GERD (gastroesophageal reflux disease)   .  Osteoporosis    Past Surgical History  Procedure Laterality Date  . Mastectomy Left 1990  . Breast surgery      reconstructive  . Cataract extraction Bilateral 2011    Dr. Kathrin Penner  . Melanoma excision      back, fore arm     Allergies  Allergen Reactions  . Actonel [Risedronate] Other (See Comments)    Per patients mar  . Boniva [Ibandronic Acid] Other (See Comments)    Per patiens mar  . Fosamax [Alendronate] Other (See Comments)    Per patiens mar  . Miacalcin [Calcitonin] Other (See Comments)    Per patients mar  . Sulfa Antibiotics Itching      Medication List       This list is accurate as of: 04/26/15 12:27 PM.  Always use your most recent med list.               acetaminophen 500 MG tablet  Commonly known as:  TYLENOL  Take 1,000 mg by mouth 2 (two) times daily. DO NOT EXCEED 3GM IN 24 HOURS     omeprazole 20 MG capsule  Commonly known as:  PRILOSEC  Take 1 capsule (20 mg total) by mouth daily.     ranitidine 150 MG tablet  Commonly known as:  ZANTAC  Take 150 mg by mouth daily as needed for heartburn.     senna 176 MG/5ML Syrp  Commonly known  as:  SENOKOT  Take 10 mLs by mouth every morning. Mix in chocolate milk     Vitamin D 2000 units Caps  Take 1 capsule (2,000 Units total) by mouth daily.        Review of Systems  Constitutional: Negative for activity change and appetite change.  HENT: Positive for hearing loss.   Eyes: Negative for visual disturbance.  Respiratory: Negative for shortness of breath.   Cardiovascular: Negative for chest pain and leg swelling.  Gastrointestinal: Positive for vomiting and constipation. Negative for nausea, abdominal pain and diarrhea.  Genitourinary: Negative for dysuria.  Musculoskeletal: Positive for gait problem.       Walks with walker, kyphoscoliotic  Neurological: Negative for dizziness and light-headedness.  Psychiatric/Behavioral: Positive for confusion. Negative for sleep disturbance.     Immunization History  Administered Date(s) Administered  . Influenza-Unspecified 10/28/2014   Pertinent  Health Maintenance Due  Topic Date Due  . DEXA SCAN  10/15/1991  . PNA vac Low Risk Adult (1 of 2 - PCV13) 10/15/1991  . INFLUENZA VACCINE  08/16/2015   Fall Risk  12/30/2014 05/27/2014  Falls in the past year? Yes Yes  Number falls in past yr: 2 or more 2 or more  Injury with Fall? No No  Risk Factor Category  High Fall Risk High Fall Risk  Risk for fall due to : History of fall(s);Impaired balance/gait History of fall(s)  Follow up Falls evaluation completed Falls evaluation completed   Functional Status Survey: Is the patient deaf or have difficulty hearing?: Yes Does the patient have difficulty seeing, even when wearing glasses/contacts?: No Does the patient have difficulty concentrating, remembering, or making decisions?: Yes Does the patient have difficulty walking or climbing stairs?: Yes Does the patient have difficulty dressing or bathing?: Yes Does the patient have difficulty doing errands alone such as visiting a doctor's office or shopping?: Yes  Filed Vitals:   04/26/15 1055  BP: 163/77  Pulse: 80  Temp: 97.7 F (36.5 C)  TempSrc: Oral  Resp: 18  Height: 5' (1.524 m)  Weight: 117 lb (53.071 kg)  SpO2: 98%   Body mass index is 22.85 kg/(m^2). Physical Exam  Constitutional: She appears well-developed and well-nourished. No distress.  Cardiovascular: Normal rate and regular rhythm.   Pulmonary/Chest: Effort normal and breath sounds normal.  Abdominal: Soft. Bowel sounds are normal. She exhibits no distension. There is no tenderness.  Musculoskeletal:  Severe kyphoscoliosis and leaning to her left unchanged  Neurological: She is alert.  Pleasant, does not volunteer responses, but does answer   Skin: Skin is warm and dry.  Psychiatric: She has a normal mood and affect.    Labs reviewed:  Recent Labs  08/05/14 01/04/15  NA 141 140  K 4.0 4.2   BUN 16 21  CREATININE 1.2* 1.0    Recent Labs  08/05/14  AST 20  ALT 20  ALKPHOS 46    Recent Labs  08/05/14 09/23/14 01/04/15  WBC 5.3 6.5 5.8  HGB 9.8* 9.9* 9.2*  HCT 29* 31* 28*  PLT 407* 534* 434*   No results found for: TSH Lab Results  Component Value Date   HGBA1C 5.8* 01/19/2014   Lab Results  Component Value Date   CHOL 186 02/22/2014   HDL 57 02/22/2014   LDLCALC 112 02/22/2014   TRIG 86 02/22/2014   CHOLHDL 4.0 01/19/2014    Assessment/Plan 1. Late onset Alzheimer's disease without behavioral disturbance -in SNF, is ambulatory with walker -not on medications for  this -mmse gradually declining  2. Senile osteoporosis -family refused medications, cont weightbearing exercise and vitmain D supplement  3. Scoliosis (and kyphoscoliosis), idiopathic -takes scheduled and some prn tylenol to help with pain  4. Slow transit constipation -cont senokot syrup daily which she does take -bms are only every 3 days or so which she says does not bother her  5. Weakness -continue use of walker for support and help with ADLs.   6.  GERD:  Continues on zantac and prilosec -likely in large part due to her posture  7.  Need for prevnar pneumonia vaccine: -13 valent pneumonia shot ordered today if approved by her family  Family/ staff Communication: discussed with SNF nurse  Labs/tests ordered:  No new necessary

## 2015-04-26 NOTE — [facility] Notes (Signed)
Patient ID: Tanya Leblanc, female   DOB: 07/27/1924, 82 y.o.   MRN: [id number]  Location:  [facility], SNF Provider:  Royal Hawthorn, NP   Code Status:  DNR Goals of care: Advanced Directive information Advanced Directives 04/26/2015  Does patient have an advance directive? Yes  Type of Advance Directive Living will;Healthcare Power of [place];Out of facility DNR (pink MOST or yellow form)  Copy of advanced directive(s) in chart? Yes  Pre-existing out of facility DNR order (yellow form or pink MOST form) Yellow form placed in chart (order not valid for inpatient use)     Chief Complaint  Patient presents with  . Medical Management of Chronic Issues    HPI:  Pt is a 82 y.o. female seen today for medical management of chronic diseases.  Resident has a hx of dementia, anemia, and significant scoliosis. Remains ambulatory and verbal. Last MMSE 19/30 on 06/18/14 with a failed clock, poor recall, attention,calculation, and orientation. She has no complaints today.  1. Alzheimer's dementia without behavioral disturbance, unspecified timing of dementia onset -progressive decline, see above -not currently on meds  2. Scoliosis (and kyphoscoliosis), idiopathic -significant deformity -intermittent reports pain but denies today -no radiculopathy reported -uses tylenol as needed  3. Slow transit constipation -currently on senna -denies issues but is a poor historian  4. Abnormality of gait -frequent falls over time -uses walker  Functional status: ambulatory with a walker  Review of Systems  Constitutional: Negative for fever, chills, weight loss, malaise/fatigue and diaphoresis.  HENT: Negative for congestion.   Respiratory: Negative for cough and shortness of breath.   Cardiovascular: Positive for leg swelling. Negative for chest pain, palpitations and orthopnea.  Gastrointestinal: Negative for vomiting, abdominal pain and diarrhea.  Genitourinary:  Negative for dysuria.  Musculoskeletal: Positive for falls. Negative for myalgias, back pain, joint pain and neck pain.  Skin: Negative for rash.  Neurological: Negative for dizziness, speech change, focal weakness and weakness.  Psychiatric/Behavioral: Positive for memory loss. Negative for depression, suicidal ideas, hallucinations and substance abuse. The patient is not nervous/anxious.     Past Medical History  Diagnosis Date  . Scoliosis   . Low back pain   . Chronic kidney disease   . Amnesia   . Cancer ([place])   . Breast CA ([place])   . Melanoma ([place])   . GERD (gastroesophageal reflux disease)   . Osteoporosis    Past Surgical History  Procedure Laterality Date  . Mastectomy Left 1990  . Breast surgery      reconstructive  . Cataract extraction Bilateral 2011    Dr. Kathrin Penner  . Melanoma excision      back, fore arm     Allergies  Allergen Reactions  . Actonel [Risedronate] Other (See Comments)    Per patients mar  . Boniva [Ibandronic Acid] Other (See Comments)    Per patiens mar  . Fosamax [Alendronate] Other (See Comments)    Per patiens mar  . Miacalcin [Calcitonin] Other (See Comments)    Per patients mar  . Sulfa Antibiotics Itching      Medication List       This list is accurate as of: 04/15/15 11:59 PM.  Always use your most recent med list.               acetaminophen 500 MG tablet  Commonly known as:  TYLENOL  Take 1 tablet (500 mg total) by mouth every 6 (six) hours as needed for mild pain.  omeprazole 20 MG capsule  Commonly known as:  PRILOSEC  Take 1 capsule (20 mg total) by mouth daily.     ranitidine 150 MG tablet  Commonly known as:  ZANTAC  Take 150 mg by mouth daily as needed for heartburn.     sennosides 8.8 MG/5ML syrup  Commonly known as:  SENOKOT  Take 10 mLs by mouth at bedtime.     Vitamin D 2000 units Caps  Take 1 capsule (2,000 Units total) by mouth daily.        Immunization History  Administered Date(s)  Administered  . Influenza-Unspecified 10/28/2014   Pertinent  Health Maintenance Due  Topic Date Due  . DEXA SCAN  10/15/1991  . PNA vac Low Risk Adult (1 of 2 - PCV13) 10/15/1991  . INFLUENZA VACCINE  08/16/2015   Fall Risk  12/30/2014 05/27/2014  Falls in the past year? Yes Yes  Number falls in past yr: 2 or more 2 or more  Injury with Fall? No No  Risk Factor Category  High Fall Risk High Fall Risk  Risk for fall due to : History of fall(s);Impaired balance/gait History of fall(s)  Follow up Falls evaluation completed Falls evaluation completed    Filed Vitals:   04/26/15 1058  BP: 120/65  Pulse: 89  Temp: 97.2 F (36.2 C)  Resp: 18  Weight: 117 lb 9.6 oz (53.343 kg)  SpO2: 94%   Wt Readings from Last 3 Encounters:  04/26/15 117 lb (53.071 kg)  04/26/15 117 lb 9.6 oz (53.343 kg)  03/03/15 117 lb 12.8 oz (53.434 kg)    Physical Exam  Constitutional: No distress.  HENT:  Head: Normocephalic and atraumatic.  Cardiovascular: Normal rate and regular rhythm.   No murmur heard. +1 edema to both ankles and feet  Pulmonary/Chest: Effort normal and breath sounds normal. No respiratory distress.  Abdominal: Soft. Bowel sounds are normal. She exhibits no distension.  Musculoskeletal: She exhibits no edema or tenderness.  Significant scoliosis  Neurological: She is alert. No cranial nerve deficit.  Oriented to self and situation  Skin: Skin is warm and dry. She is not diaphoretic. No erythema. No pallor.  Psychiatric: She has a normal mood and affect.    Labs reviewed:  Recent Labs  08/05/14 01/04/15  NA 141 140  K 4.0 4.2  BUN 16 21  CREATININE 1.2* 1.0    Recent Labs  08/05/14  AST 20  ALT 20  ALKPHOS 46    Recent Labs  08/05/14 09/23/14 01/04/15  WBC 5.3 6.5 5.8  HGB 9.8* 9.9* 9.2*  HCT 29* 31* 28*  PLT 407* 534* 434*   No results found for: TSH Lab Results  Component Value Date   HGBA1C 5.8* 01/19/2014   Lab Results  Component Value Date    CHOL 186 02/22/2014   HDL 57 02/22/2014   LDLCALC 112 02/22/2014   TRIG 86 02/22/2014   CHOLHDL 4.0 01/19/2014    Significant Diagnostic Results in last 30 days:  No results found.  Assessment/Plan  1. Alzheimer's dementia without behavioral disturbance, unspecified timing of dementia onset -progressive decline in MMSE scores, stable functional status -continue supportive care, no meds per family/resident wishes  2. Scoliosis (and kyphoscoliosis), idiopathic -significant deformity but denies pain today -continue Tylenol as needed  3. Slow transit constipation -difficult to assess as the resident takes herself to the BR at times (poor historian) -abd exam normal, continue senna  4. Abnormality of gait -due to #2, age, dementia  -currently  continues to use a rolling walker and needs assistance -has had several falls -continue walker use, working Art gallery manager, and fall Research scientist (life sciences) Communication: discussed with nurse  Labs/tests ordered: none  [name], [place] [phone]

## 2015-06-02 ENCOUNTER — Non-Acute Institutional Stay (SKILLED_NURSING_FACILITY): Payer: Medicare Other | Admitting: Adult Health

## 2015-06-02 ENCOUNTER — Encounter: Payer: Self-pay | Admitting: Adult Health

## 2015-06-02 DIAGNOSIS — D649 Anemia, unspecified: Secondary | ICD-10-CM | POA: Diagnosis not present

## 2015-06-02 DIAGNOSIS — K219 Gastro-esophageal reflux disease without esophagitis: Secondary | ICD-10-CM

## 2015-06-02 DIAGNOSIS — M4120 Other idiopathic scoliosis, site unspecified: Secondary | ICD-10-CM

## 2015-06-02 DIAGNOSIS — K449 Diaphragmatic hernia without obstruction or gangrene: Secondary | ICD-10-CM

## 2015-06-02 DIAGNOSIS — K5901 Slow transit constipation: Secondary | ICD-10-CM | POA: Diagnosis not present

## 2015-06-02 NOTE — [facility] Notes (Signed)
Patient ID: Tanya Leblanc, female   DOB: Feb 11, 1924, 82 y.o.   MRN: [id number]  Location:  [facility], SNF Provider:  Royal Hawthorn, NP   Code Status:  DNR Goals of care: Advanced Directive information Advanced Directives 04/26/2015  Does patient have an advance directive? Yes  Type of Advance Directive Living will;Healthcare Power of [place];Out of facility DNR (pink MOST or yellow form)  Copy of advanced directive(s) in chart? Yes  Pre-existing out of facility DNR order (yellow form or pink MOST form) Yellow form placed in chart (order not valid for inpatient use)     Chief Complaint  Patient presents with  . Medical Management of Chronic Issues    HPI:  Pt is a 82 y.o. female seen today for medical management of chronic diseases.  Resident has a hx of dementia, anemia, constipation, gerd, hiatal hernia, and significant scoliosis.  VS stable, weight stable at 116.6 lb. Resident reported CP on 5/13, CXR ordered was negative. She has a hx of hiatal hernia and reflux and is on prilosec qd. She has no complaints today. BMs are regular. Pleasant with minimal verbalization for our visit.  Functional status: ambulatory with a walker  Review of Systems  Constitutional: Negative for fever, chills, weight loss, malaise/fatigue and diaphoresis.  HENT: Negative for congestion.   Respiratory: Negative for cough and shortness of breath.   Cardiovascular: Positive for leg swelling. Negative for chest pain, palpitations and orthopnea.  Gastrointestinal: Negative for vomiting, abdominal pain and diarrhea.  Genitourinary: Negative for dysuria.  Musculoskeletal: Positive for falls. Negative for myalgias, back pain, joint pain and neck pain.  Skin: Negative for rash.  Neurological: Negative for dizziness, speech change, focal weakness and weakness.  Psychiatric/Behavioral: Positive for memory loss. Negative for depression, suicidal ideas, hallucinations and substance  abuse. The patient is not nervous/anxious.     Past Medical History  Diagnosis Date  . Scoliosis   . Low back pain   . Chronic kidney disease   . Amnesia   . Cancer ([place])   . Breast CA ([place])   . Melanoma ([place])   . GERD (gastroesophageal reflux disease)   . Osteoporosis    Past Surgical History  Procedure Laterality Date  . Mastectomy Left 1990  . Breast surgery      reconstructive  . Cataract extraction Bilateral 2011    Dr. Kathrin Penner  . Melanoma excision      back, fore arm     Allergies  Allergen Reactions  . Actonel [Risedronate] Other (See Comments)    Per patients mar  . Boniva [Ibandronic Acid] Other (See Comments)    Per patiens mar  . Fosamax [Alendronate] Other (See Comments)    Per patiens mar  . Miacalcin [Calcitonin] Other (See Comments)    Per patients mar  . Sulfa Antibiotics Itching      Medication List       This list is accurate as of: 06/02/15  4:37 PM.  Always use your most recent med list.               acetaminophen 500 MG tablet  Commonly known as:  TYLENOL  Take 1,000 mg by mouth 2 (two) times daily. DO NOT EXCEED 3GM IN 24 HOURS     omeprazole 20 MG capsule  Commonly known as:  PRILOSEC  Take 1 capsule (20 mg total) by mouth daily.     ranitidine 150 MG tablet  Commonly known as:  ZANTAC  Take 150 mg by  mouth daily as needed for heartburn.     senna 176 MG/5ML Syrp  Commonly known as:  SENOKOT  Take 10 mLs by mouth 2 (two) times daily. Mix in chocolate milk     Vitamin D 2000 units Caps  Take 1 capsule (2,000 Units total) by mouth daily.        Immunization History  Administered Date(s) Administered  . Influenza-Unspecified 10/28/2014   Pertinent  Health Maintenance Due  Topic Date Due  . PNA vac Low Risk Adult (1 of 2 - PCV13) 10/15/1991  . INFLUENZA VACCINE  08/16/2015   Fall Risk  12/30/2014 05/27/2014  Falls in the past year? Yes Yes  Number falls in past yr: 2 or more 2 or more  Injury with Fall? No No    Risk Factor Category  High Fall Risk High Fall Risk  Risk for fall due to : History of fall(s);Impaired balance/gait History of fall(s)  Follow up Falls evaluation completed Falls evaluation completed    Filed Vitals:   06/02/15 1632  BP: 129/69  Pulse: 78  Temp: 97 F (36.1 C)  Resp: 18  Weight: 116 lb 9.6 oz (52.889 kg)  SpO2: 98%   Wt Readings from Last 3 Encounters:  06/02/15 116 lb 9.6 oz (52.889 kg)  04/26/15 117 lb (53.071 kg)  04/26/15 117 lb 9.6 oz (53.343 kg)    Physical Exam  Constitutional: No distress.  HENT:  Head: Normocephalic and atraumatic.  Cardiovascular: Normal rate and regular rhythm.   No murmur heard. +1 edema to both ankles and feet  Pulmonary/Chest: Effort normal and breath sounds normal. No respiratory distress.  Abdominal: Soft. Bowel sounds are normal. She exhibits no distension.  Musculoskeletal: She exhibits no edema or tenderness.  Significant scoliosis  Neurological: She is alert. No cranial nerve deficit.  Oriented to self and situation  Skin: Skin is warm and dry. She is not diaphoretic. No erythema. No pallor.  Psychiatric: She has a normal mood and affect.    Labs reviewed:  Recent Labs  08/05/14 01/04/15  NA 141 140  K 4.0 4.2  BUN 16 21  CREATININE 1.2* 1.0    Recent Labs  08/05/14  AST 20  ALT 20  ALKPHOS 46    Recent Labs  08/05/14 09/23/14 01/04/15  WBC 5.3 6.5 5.8  HGB 9.8* 9.9* 9.2*  HCT 29* 31* 28*  PLT 407* 534* 434*   No results found for: TSH Lab Results  Component Value Date   HGBA1C 5.8* 01/19/2014   Lab Results  Component Value Date   CHOL 186 02/22/2014   HDL 57 02/22/2014   LDLCALC 112 02/22/2014   TRIG 86 02/22/2014   CHOLHDL 4.0 01/19/2014    Significant Diagnostic Results in last 30 days:  No results found.  Assessment/Plan  1. Hiatal hernia -noted on previous study with CP reported on 5/13 which resovled -will increase prilosec to BID  2. Gastroesophageal reflux disease,  esophagitis presence not specified -stable -see above  3. Slow transit constipation -continue senokot  4. Scoliosis (and kyphoscoliosis), idiopathic -denies pain -limited mobility due to this, uses walker -continue tylenol  5. Anemia, unspecified anemia type -check CBC   [name], ANP [facility] [phone]

## 2015-06-03 LAB — BASIC METABOLIC PANEL WITH GFR
BUN: 18 mg/dL (ref 4–21)
Creatinine: 0.9 mg/dL (ref 0.5–1.1)
Glucose: 84 mg/dL
Potassium: 4.5 mmol/L (ref 3.4–5.3)
Sodium: 141 mmol/L (ref 137–147)

## 2015-06-03 LAB — CBC AND DIFFERENTIAL
HCT: 31 % — AB (ref 36–46)
Hemoglobin: 10 g/dL — AB (ref 12.0–16.0)
Platelets: 562 K/µL — AB (ref 150–399)
WBC: 6 10*3/mL

## 2015-07-07 ENCOUNTER — Encounter: Payer: Self-pay | Admitting: Adult Health

## 2015-07-07 ENCOUNTER — Non-Acute Institutional Stay (SKILLED_NURSING_FACILITY): Payer: Medicare Other | Admitting: Adult Health

## 2015-07-07 DIAGNOSIS — M4120 Other idiopathic scoliosis, site unspecified: Secondary | ICD-10-CM

## 2015-07-07 DIAGNOSIS — K5901 Slow transit constipation: Secondary | ICD-10-CM

## 2015-07-07 DIAGNOSIS — D649 Anemia, unspecified: Secondary | ICD-10-CM

## 2015-07-07 DIAGNOSIS — K219 Gastro-esophageal reflux disease without esophagitis: Secondary | ICD-10-CM | POA: Diagnosis not present

## 2015-07-07 NOTE — Progress Notes (Signed)
Patient ID: Tanya Leblanc, female   DOB: 28-Mar-1924, 82 y.o.   MRN: [id number]  Location:  [facility] Provider:  Royal Hawthorn, NP   Code Status:  DNR Goals of care: Advanced Directive information Advanced Directives 04/26/2015  Does patient have an advance directive? Yes  Type of Advance Directive Living will;Healthcare Power of [place];Out of facility DNR (pink MOST or yellow form)  Copy of advanced directive(s) in chart? Yes  Pre-existing out of facility DNR order (yellow form or pink MOST form) Yellow form placed in chart (order not valid for inpatient use)     Chief Complaint  Patient presents with  . Medical Management of Chronic Issues    HPI:  Pt is a 82 y.o. female seen today for medical management of chronic diseases.  Resident has a hx of dementia, anemia, constipation, gerd, hiatal hernia, and significant scoliosis.  VS stable, weight stable at 115.4 lb. No reports of chest pain since 5/13. She has a hx of hiatal hernia and reflux and is on prilosec bid and Zantac prn. She complains of a mild headache today. No other complaints. BMs every 3-4 days without straining. Pleasant demeanor and responds to questions appropriately during visit.  Functional status: ambulatory with a walker  Review of Systems  Constitutional: Negative for fever, chills, weight loss, malaise/fatigue and diaphoresis.  HENT: Negative for congestion.   Respiratory: Negative.  Negative for shortness of breath.   Cardiovascular: Negative for chest pain, palpitations, orthopnea and leg swelling.  Gastrointestinal: Negative for vomiting, abdominal pain and diarrhea.  Genitourinary: Negative.  Negative for dysuria.  Musculoskeletal: Positive for falls. Negative for myalgias, back pain, joint pain and neck pain.  Skin: Negative for rash.  Neurological: Positive for headaches. Negative for dizziness, speech change, focal weakness and weakness.  Psychiatric/Behavioral:  Positive for memory loss. Negative for depression, suicidal ideas, hallucinations and substance abuse. The patient is not nervous/anxious.     Past Medical History  Diagnosis Date  . Scoliosis   . Low back pain   . Chronic kidney disease   . Amnesia   . Cancer ([place])   . Breast CA ([place])   . Melanoma ([place])   . GERD (gastroesophageal reflux disease)   . Osteoporosis    Past Surgical History  Procedure Laterality Date  . Mastectomy Left 1990  . Breast surgery      reconstructive  . Cataract extraction Bilateral 2011    Dr. Kathrin Leblanc  . Melanoma excision      back, fore arm     Allergies  Allergen Reactions  . Actonel [Risedronate] Other (See Comments)    Per patients mar  . Boniva [Ibandronic Acid] Other (See Comments)    Per patiens mar  . Fosamax [Alendronate] Other (See Comments)    Per patiens mar  . Miacalcin [Calcitonin] Other (See Comments)    Per patients mar  . Sulfa Antibiotics Itching      Medication List       This list is accurate as of: 07/07/15 11:56 AM.  Always use your most recent med list.               acetaminophen 500 MG tablet  Commonly known as:  TYLENOL  Take 1,000 mg by mouth 2 (two) times daily. DO NOT EXCEED 3GM IN 24 HOURS     omeprazole 20 MG capsule  Commonly known as:  PRILOSEC  Take 1 capsule (20 mg total) by mouth daily.     ranitidine 150  MG tablet  Commonly known as:  ZANTAC  Take 150 mg by mouth daily as needed for heartburn.     senna 176 MG/5ML Syrp  Commonly known as:  SENOKOT  Take 10 mLs by mouth 2 (two) times daily. Mix in chocolate milk     Vitamin D 2000 units Caps  Take 1 capsule (2,000 Units total) by mouth daily.        Immunization History  Administered Date(s) Administered  . Influenza-Unspecified 10/28/2014   Pertinent  Health Maintenance Due  Topic Date Due  . PNA vac Low Risk Adult (1 of 2 - PCV13) 10/15/1991  . INFLUENZA VACCINE  08/16/2015   Fall Risk  12/30/2014 05/27/2014  Falls in the  past year? Yes Yes  Number falls in past yr: 2 or more 2 or more  Injury with Fall? No No  Risk Factor Category  High Fall Risk High Fall Risk  Risk for fall due to : History of fall(s);Impaired balance/gait History of fall(s)  Follow up Falls evaluation completed Falls evaluation completed    Filed Vitals:   07/07/15 1145  BP: 141/73  Pulse: 74  Temp: 97.3 F (36.3 C)  Resp: 18  Weight: 115 lb 6.4 oz (52.345 kg)  SpO2: 96%   Wt Readings from Last 3 Encounters:  07/07/15 115 lb 6.4 oz (52.345 kg)  06/02/15 116 lb 9.6 oz (52.889 kg)  04/26/15 117 lb (53.071 kg)    Physical Exam  Constitutional: No distress.  HENT:  Head: Normocephalic and atraumatic.  Cardiovascular: Normal rate, regular rhythm and intact distal pulses.   No murmur heard. Pulmonary/Chest: Effort normal and breath sounds normal. No respiratory distress.  Abdominal: Soft. Bowel sounds are normal. She exhibits no distension. There is no tenderness.  Musculoskeletal: She exhibits no edema or tenderness.  Significant scoliosis  Neurological: She is alert. No cranial nerve deficit.  Oriented to self and situation  Skin: Skin is warm and dry. She is not diaphoretic. No erythema. No pallor.  Psychiatric: She has a normal mood and affect.    Labs reviewed:  Recent Labs  08/05/14 01/04/15 06/03/15  NA 141 140 141  K 4.0 4.2 4.5  BUN 16 21 18   CREATININE 1.2* 1.0 0.9    Recent Labs  08/05/14  AST 20  ALT 20  ALKPHOS 46    Recent Labs  09/23/14 01/04/15 06/03/15  WBC 6.5 5.8 6.0  HGB 9.9* 9.2* 10.0*  HCT 31* 28* 31*  PLT 534* 434* 562*   No results found for: TSH Lab Results  Component Value Date   HGBA1C 5.8* 01/19/2014   Lab Results  Component Value Date   CHOL 186 02/22/2014   HDL 57 02/22/2014   LDLCALC 112 02/22/2014   TRIG 86 02/22/2014   CHOLHDL 4.0 01/19/2014    Significant Diagnostic Results in last 30 days:  No results found.  Assessment/Plan  1. Gastroesophageal reflux  disease without esophagitis Continue omeprazole 20mg  bid ac meals and Zantac 150mg  prn  2. Anemia, unspecified anemia type Hgb 10.0, improved from 12/16 9.2.  Continue to monitor periodically.  3. Slow transit constipation Reports BM every 3-4 days without straining. Continue Senna bid.  4. Scoliosis (and kyphoscoliosis), idiopathic No complaints of pain currently. Utilize Tylenol ES bid prn. Family not interested in pursuit of tx.    [name], ANP [facility] [phone]

## 2015-07-12 ENCOUNTER — Encounter: Payer: Self-pay | Admitting: Internal Medicine

## 2015-08-09 ENCOUNTER — Non-Acute Institutional Stay (SKILLED_NURSING_FACILITY): Payer: Medicare Other | Admitting: Internal Medicine

## 2015-08-09 ENCOUNTER — Encounter: Payer: Self-pay | Admitting: Internal Medicine

## 2015-08-09 DIAGNOSIS — K5901 Slow transit constipation: Secondary | ICD-10-CM

## 2015-08-09 DIAGNOSIS — R269 Unspecified abnormalities of gait and mobility: Secondary | ICD-10-CM | POA: Diagnosis not present

## 2015-08-09 DIAGNOSIS — K219 Gastro-esophageal reflux disease without esophagitis: Secondary | ICD-10-CM | POA: Diagnosis not present

## 2015-08-09 DIAGNOSIS — G301 Alzheimer's disease with late onset: Secondary | ICD-10-CM

## 2015-08-09 DIAGNOSIS — F0280 Dementia in other diseases classified elsewhere without behavioral disturbance: Secondary | ICD-10-CM | POA: Diagnosis not present

## 2015-08-09 DIAGNOSIS — H547 Unspecified visual loss: Secondary | ICD-10-CM | POA: Diagnosis not present

## 2015-08-09 DIAGNOSIS — M810 Age-related osteoporosis without current pathological fracture: Secondary | ICD-10-CM

## 2015-08-09 NOTE — Progress Notes (Signed)
Patient ID: Tanya Leblanc, female   DOB: Jun 08, 1924, 82 y.o.   MRN: [id number]  Location:   [facility] Room Number: 130 Place of Service:  SNF (31) Provider:  Alainah Phang L. Mariea Clonts, D.O., C.M.D.  Hollace Kinnier, DO  Patient Care Team: Gayland Curry, DO as PCP - General (Geriatric Medicine)  Extended Emergency Contact Information Primary Emergency Contact: Sherian Rein of [place] Phone: [phone] Mobile Phone: [phone] Relation: Daughter  Code Status:  DNR Goals of care: Advanced Directive information Advanced Directives 08/09/2015  Does patient have an advance directive? Yes  Type of Advance Directive Out of facility DNR (pink MOST or yellow form);Healthcare Power of Attorney  Does patient want to make changes to advanced directive? -  Copy of advanced directive(s) in chart? Yes  Pre-existing out of facility DNR order (yellow form or pink MOST form) Yellow form placed in chart (order not valid for inpatient use)     Chief Complaint  Patient presents with  . Medical Management of Chronic Issues    routine visit    HPI:  Pt is a 82 y.o. female seen today for medical management of chronic diseases.    Pt c/o worsening of her vision and requesting to see ophthalmologist.  Says it's most bothersome when reading, but also has more difficulty with distance vision.    She is now on bid senna for her constipation with benefit per 2nd shift nursing.    Prilosec was added to her regimen for chest pain in May that was felt to be due to GERD.  She denies any chest pains or indigestion symptoms at this time.   She continues to ambulate some with her walker with severely stooped posture due to kyphoscoliosis and senile osteoporosis.    She had no other complaints or concerns and neither did nursing.   Past Medical History:  Diagnosis Date  . Amnesia   . Breast CA ([place])   . Cancer ([place])   . Chronic kidney disease   . GERD  (gastroesophageal reflux disease)   . Low back pain   . Melanoma ([place])   . Osteoporosis   . Scoliosis    Past Surgical History:  Procedure Laterality Date  . BREAST SURGERY     reconstructive  . CATARACT EXTRACTION Bilateral 2011   Dr. Kathrin Penner  . MASTECTOMY Left 1990  . MELANOMA EXCISION     back, fore arm     Allergies  Allergen Reactions  . Actonel [Risedronate] Other (See Comments)    Per patients mar  . Boniva [Ibandronic Acid] Other (See Comments)    Per patiens mar  . Fosamax [Alendronate] Other (See Comments)    Per patiens mar  . Miacalcin [Calcitonin] Other (See Comments)    Per patients mar  . Sulfa Antibiotics Itching      Medication List       Accurate as of 08/09/15 11:13 AM. Always use your most recent med list.          acetaminophen 500 MG tablet Commonly known as:  TYLENOL Take 1,000 mg by mouth 2 (two) times daily. DO NOT EXCEED 3GM IN 24 HOURS   omeprazole 20 MG capsule Commonly known as:  PRILOSEC Take 20 mg by mouth 2 (two) times daily before a meal.   ranitidine 150 MG tablet Commonly known as:  ZANTAC Take 150 mg by mouth daily as needed for heartburn.   senna 176 MG/5ML Syrp Commonly known as:  SENOKOT Take 10  mLs by mouth 2 (two) times daily. Mix in chocolate milk   Vitamin D 2000 units Caps Take 1 capsule (2,000 Units total) by mouth daily.       Review of Systems  Constitutional: Negative for activity change, appetite change, chills and fever.  HENT: Negative for congestion and sinus pressure.   Eyes: Positive for visual disturbance.       Blurry vision esp reading, but also worse long distance  Respiratory: Negative for apnea, chest tightness and shortness of breath.   Cardiovascular: Negative for chest pain, palpitations and leg swelling.  Gastrointestinal: Negative for abdominal distention.  Endocrine: Negative for polydipsia and polyphagia.  Genitourinary: Negative for dysuria.  Musculoskeletal: Positive for gait  problem. Negative for back pain.  Neurological: Positive for weakness. Negative for dizziness.  Psychiatric/Behavioral: Positive for confusion.       Does not talk much    Immunization History  Administered Date(s) Administered  . Hepatitis A 11/21/2005  . Influenza, High Dose Seasonal PF 10/06/2013  . Influenza-Unspecified 10/28/2014  . Pneumococcal Conjugate-13 10/06/2013  . Pneumococcal Polysaccharide-23 12/13/1998  . Td 11/22/2004  . Typhoid Inactivated 07/02/2011  . Zoster 07/02/2011   Pertinent  Health Maintenance Due  Topic Date Due  . PNA vac Low Risk Adult (1 of 2 - PCV13) 10/15/1991  . INFLUENZA VACCINE  08/16/2015   Fall Risk  12/30/2014 05/27/2014  Falls in the past year? Yes Yes  Number falls in past yr: 2 or more 2 or more  Injury with Fall? No No  Risk Factor Category  High Fall Risk High Fall Risk  Risk for fall due to : History of fall(s);Impaired balance/gait History of fall(s)  Follow up Falls evaluation completed Falls evaluation completed   Functional Status Survey:    Vitals:   08/09/15 1106  BP: (!) 144/68  Pulse: 76  Resp: 16  Temp: 97.9 F (36.6 C)  TempSrc: Oral  SpO2: 94%  Weight: 116 lb (52.6 kg)   Body mass index is 22.65 kg/m. Physical Exam  Constitutional: She appears well-developed and well-nourished. No distress.  Cardiovascular: Normal rate, regular rhythm and normal heart sounds.   Pulmonary/Chest: Effort normal and breath sounds normal. No respiratory distress.  Abdominal: Soft. Bowel sounds are normal. She exhibits no distension and no mass. There is no tenderness. There is no rebound and no guarding.  Musculoskeletal: Normal range of motion.  Severe kyphoscoliosis; seated in recliner chair in room watching Trump on TV  Neurological: She is alert.  Skin: Skin is warm and dry.  Psychiatric: She has a normal mood and affect.  Says very little; I have to ask specific questions for any response    Labs reviewed:  Recent  Labs  01/04/15 06/03/15  NA 140 141  K 4.2 4.5  BUN 21 18  CREATININE 1.0 0.9   No results for input(s): AST, ALT, ALKPHOS, BILITOT, PROT, ALBUMIN in the last 8760 hours.  Recent Labs  09/23/14 01/04/15 06/03/15  WBC 6.5 5.8 6.0  HGB 9.9* 9.2* 10.0*  HCT 31* 28* 31*  PLT 534* 434* 562*   No results found for: TSH Lab Results  Component Value Date   HGBA1C 5.8 (H) 01/19/2014   Lab Results  Component Value Date   CHOL 186 02/22/2014   HDL 57 02/22/2014   LDLCALC 112 02/22/2014   TRIG 86 02/22/2014   CHOLHDL 4.0 01/19/2014   Assessment/Plan 1. Visual acuity reduced -if her daughter would like, she can be referred to ophthalmology for  evaluation and mgt (no recent visits in chart)  2. Senile osteoporosis -cont vitamin D and walking with walker; family refused meds for this  3. Late onset Alzheimer's disease without behavioral disturbance -is gradually worsening--she speaks less and needs more cuing and adl help  -there's also that question of if she had a small stroke before, but it seemed it was a nerve compression as brain imaging was negative and symptoms persisted of arm weakness  4. Gastroesophageal reflux disease without esophagitis - continues on zantac and prilosec, has hiatal hernia also and severe scoliosis -would try to make meds short term if she can go w/o them to decrease bony effects   5. Slow transit constipation -cont senokot bid which is effective  6. Abnormality of gait -cont use of walker  Family/ staff Communication: discussed with snf nurse  Labs/tests ordered:  ophtho referral if ok with family

## 2015-09-08 ENCOUNTER — Non-Acute Institutional Stay (SKILLED_NURSING_FACILITY): Payer: Medicare Other | Admitting: Adult Health

## 2015-09-08 DIAGNOSIS — G301 Alzheimer's disease with late onset: Secondary | ICD-10-CM

## 2015-09-08 DIAGNOSIS — D649 Anemia, unspecified: Secondary | ICD-10-CM

## 2015-09-08 DIAGNOSIS — M4120 Other idiopathic scoliosis, site unspecified: Secondary | ICD-10-CM

## 2015-09-08 DIAGNOSIS — R251 Tremor, unspecified: Secondary | ICD-10-CM

## 2015-09-08 DIAGNOSIS — F0280 Dementia in other diseases classified elsewhere without behavioral disturbance: Secondary | ICD-10-CM

## 2015-09-08 DIAGNOSIS — K219 Gastro-esophageal reflux disease without esophagitis: Secondary | ICD-10-CM | POA: Diagnosis not present

## 2015-09-11 ENCOUNTER — Encounter: Payer: Self-pay | Admitting: Adult Health

## 2015-09-11 DIAGNOSIS — K219 Gastro-esophageal reflux disease without esophagitis: Secondary | ICD-10-CM | POA: Insufficient documentation

## 2015-09-11 DIAGNOSIS — R251 Tremor, unspecified: Secondary | ICD-10-CM | POA: Insufficient documentation

## 2015-09-11 NOTE — Progress Notes (Signed)
Patient ID: Tanya Leblanc, female   DOB: 1924-04-02, 82 y.o.   MRN: [id number]  Location:  [facility], [facility] Provider:  Royal Hawthorn, NP   Code Status:  DNR Goals of care: Advanced Directive information Advanced Directives 08/09/2015  Does patient have an advance directive? Yes  Type of Advance Directive Out of facility DNR (pink MOST or yellow form);Healthcare Power of Attorney  Does patient want to make changes to advanced directive? -  Copy of advanced directive(s) in chart? Yes  Pre-existing out of facility DNR order (yellow form or pink MOST form) Yellow form placed in chart (order not valid for inpatient use)     Chief Complaint  Patient presents with  . Medical Management of Chronic Issues    HPI:  Pt is a 82 y.o. female seen today for medical management of chronic diseases.  Resident has a hx of dementia, anemia, constipation, gerd, hiatal hernia, and significant scoliosis.  VS stable, weight stable at 119 lb. Remains ambulatory with a walker. Has advanced dementia, MMSE 19/30 on 06/18/14.  Has a noted tremor to the right hand, worse than previous visit.  Noted and rest, worse with intention. Has some vision difficulties and has an apt coming up with opth.   Hx of Gerd/hiatal hernia on prilosec with no symptoms. Bowels moving with senokot 10 ml BID Severe scoliosis present, on scheduled tylenol for pain. No reports of pain per the staff.  The resident did not speak today but was pleasant and smiled.    Review of Systems  Constitutional: Negative for chills, diaphoresis, fever, malaise/fatigue and weight loss.  HENT: Negative for congestion.   Respiratory: Negative.  Negative for shortness of breath.   Cardiovascular: Negative for chest pain, palpitations, orthopnea and leg swelling.  Gastrointestinal: Negative for abdominal pain, diarrhea and vomiting.  Genitourinary: Negative.  Negative for dysuria.  Musculoskeletal: Positive for falls. Negative for  back pain, joint pain, myalgias and neck pain.  Skin: Negative for rash.  Neurological: Positive for tremors. Negative for dizziness, speech change, focal weakness, seizures, weakness and headaches.  Psychiatric/Behavioral: Positive for memory loss. Negative for depression, hallucinations, substance abuse and suicidal ideas. The patient is not nervous/anxious.     Past Medical History:  Diagnosis Date  . Amnesia   . Breast CA ([place])   . Cancer ([place])   . Chronic kidney disease   . GERD (gastroesophageal reflux disease)   . Low back pain   . Melanoma ([place])   . Osteoporosis   . Scoliosis    Past Surgical History:  Procedure Laterality Date  . BREAST SURGERY     reconstructive  . CATARACT EXTRACTION Bilateral 2011   Dr. Kathrin Penner  . MASTECTOMY Left 1990  . MELANOMA EXCISION     back, fore arm     Allergies  Allergen Reactions  . Actonel [Risedronate] Other (See Comments)    Per patients mar  . Boniva [Ibandronic Acid] Other (See Comments)    Per patiens mar  . Fosamax [Alendronate] Other (See Comments)    Per patiens mar  . Miacalcin [Calcitonin] Other (See Comments)    Per patients mar  . Sulfa Antibiotics Itching      Medication List       Accurate as of 09/08/15 11:59 PM. Always use your most recent med list.          acetaminophen 500 MG tablet Commonly known as:  TYLENOL Take 1,000 mg by mouth 2 (two) times daily. DO NOT EXCEED 3GM  IN 24 HOURS   omeprazole 20 MG capsule Commonly known as:  PRILOSEC Take 20 mg by mouth 2 (two) times daily before a meal.   senna 176 MG/5ML Syrp Commonly known as:  SENOKOT Take 10 mLs by mouth 2 (two) times daily. Mix in chocolate milk   Vitamin D 2000 units Caps Take 1 capsule (2,000 Units total) by mouth daily.       Immunization History  Administered Date(s) Administered  . Hepatitis A 11/21/2005  . Influenza, High Dose Seasonal PF 10/06/2013  . Influenza-Unspecified 10/28/2014  . Pneumococcal Conjugate-13  10/06/2013  . Pneumococcal Polysaccharide-23 12/13/1998  . Td 11/22/2004  . Typhoid Inactivated 07/02/2011  . Zoster 07/02/2011   Pertinent  Health Maintenance Due  Topic Date Due  . INFLUENZA VACCINE  08/16/2015  . PNA vac Low Risk Adult  Completed   Fall Risk  12/30/2014 05/27/2014  Falls in the past year? Yes Yes  Number falls in past yr: 2 or more 2 or more  Injury with Fall? No No  Risk Factor Category  High Fall Risk High Fall Risk  Risk for fall due to : History of fall(s);Impaired balance/gait History of fall(s)  Follow up Falls evaluation completed Falls evaluation completed    Vitals:   09/08/15 1222  BP: (!) (P) 149/74  Pulse: (P) 75  Resp: (P) 16  Temp: (!) (P) 96.7 F (35.9 C)  Weight: (P) 119 lb (54 kg)   Wt Readings from Last 3 Encounters:  09/08/15 (P) 119 lb (54 kg)  08/09/15 116 lb (52.6 kg)  07/07/15 115 lb 6.4 oz (52.3 kg)    Physical Exam  Constitutional: No distress.  HENT:  Head: Normocephalic and atraumatic.  Cardiovascular: Normal rate, regular rhythm and intact distal pulses.   No murmur heard. Pulmonary/Chest: Effort normal and breath sounds normal. No respiratory distress.  Abdominal: Soft. Bowel sounds are normal. She exhibits no distension. There is no tenderness.  Musculoskeletal: She exhibits no edema or tenderness.  Significant scoliosis  Neurological: She is alert. No cranial nerve deficit.  Did not speak today but was able to follow commands. Tremor noted during exam at rest, worse with intention.  Has some rigidity to the upper ext as well, but may have been resisting.  Skin: Skin is warm and dry. She is not diaphoretic. No erythema. No pallor.  Psychiatric: She has a normal mood and affect.    Labs reviewed:  Recent Labs  01/04/15 06/03/15  NA 140 141  K 4.2 4.5  BUN 21 18  CREATININE 1.0 0.9   No results for input(s): AST, ALT, ALKPHOS, BILITOT, PROT, ALBUMIN in the last 8760 hours.  Recent Labs  09/23/14 01/04/15  06/03/15  WBC 6.5 5.8 6.0  HGB 9.9* 9.2* 10.0*  HCT 31* 28* 31*  PLT 534* 434* 562*   No results found for: TSH Lab Results  Component Value Date   HGBA1C 5.8 (H) 01/19/2014   Lab Results  Component Value Date   CHOL 186 02/22/2014   HDL 57 02/22/2014   LDLCALC 112 02/22/2014   TRIG 86 02/22/2014   CHOLHDL 4.0 01/19/2014    Significant Diagnostic Results in last 30 days:  No results found.  Assessment/Plan  1. Gastroesophageal reflux disease without esophagitis Stable Continue prilosec BID  2. Late onset Alzheimer's disease without behavioral disturbance Progressive worse over time Not on meds due to lack of benefit Continue supportive care Have staff obtain another MMSE  3. Scoliosis (and kyphoscoliosis), idiopathic Uses walker  for assistance and to prevent falls Continue tylenol 1000mg  BID  4. Anemia, unspecified anemia type Stable, continue to monitor CBC q 6-12 mo  5. Tremor Noted to be worse than previous visit No intervention needed as it is not interfering with her ADLs Unclear etiology, will re eval next month    [name], [place] [phone]

## 2015-10-13 ENCOUNTER — Encounter: Payer: Self-pay | Admitting: Adult Health

## 2015-10-13 ENCOUNTER — Non-Acute Institutional Stay (SKILLED_NURSING_FACILITY): Payer: Medicare Other | Admitting: Adult Health

## 2015-10-13 DIAGNOSIS — G301 Alzheimer's disease with late onset: Secondary | ICD-10-CM

## 2015-10-13 DIAGNOSIS — M810 Age-related osteoporosis without current pathological fracture: Secondary | ICD-10-CM | POA: Diagnosis not present

## 2015-10-13 DIAGNOSIS — F0280 Dementia in other diseases classified elsewhere without behavioral disturbance: Secondary | ICD-10-CM

## 2015-10-13 DIAGNOSIS — D649 Anemia, unspecified: Secondary | ICD-10-CM

## 2015-10-13 DIAGNOSIS — R296 Repeated falls: Secondary | ICD-10-CM | POA: Diagnosis not present

## 2015-10-13 DIAGNOSIS — R251 Tremor, unspecified: Secondary | ICD-10-CM | POA: Diagnosis not present

## 2015-10-13 NOTE — Progress Notes (Signed)
Patient ID: Tanya Leblanc, female   DOB: 07-21-1924, 82 y.o.   MRN: [id number]  Location:  [facility], [facility] Provider:  Royal Hawthorn, NP   Code Status:  DNR Goals of care: Advanced Directive information Advanced Directives 10/13/2015  Does patient have an advance directive? Yes  Type of Paramedic of [place];Living will;Out of facility DNR (pink MOST or yellow form)  Does patient want to make changes to advanced directive? -  Copy of advanced directive(s) in chart? Yes  Pre-existing out of facility DNR order (yellow form or pink MOST form) -     Chief Complaint  Patient presents with  . Medical Management of Chronic Issues    HPI:  Pt is a 82 y.o. female seen today for medical management of chronic diseases.  Resident has a hx of dementia, anemia, constipation, gerd, hiatal hernia, and significant scoliosis.  VS stable, weight stable at 119 lb. Remains ambulatory with a walker. Has advanced dementia, MMSE 4/30 on 09/09/15.  Has a noted tremor to the right hand, stable from last visit.  No complaints today regarding her care.  Frequently falls due to attempts to get oob without help.   Hx of anemia, heme neg stool. H/H stable in May of 2017 10/31.   Bp slightly elevated at 154/75  Review of Systems  Unable to perform ROS: Dementia    Past Medical History:  Diagnosis Date  . Amnesia   . Breast CA ([place])   . Cancer ([place])   . Chronic kidney disease   . GERD (gastroesophageal reflux disease)   . Low back pain   . Melanoma ([place])   . Osteoporosis   . Scoliosis    Past Surgical History:  Procedure Laterality Date  . BREAST SURGERY     reconstructive  . CATARACT EXTRACTION Bilateral 2011   Dr. Kathrin Penner  . MASTECTOMY Left 1990  . MELANOMA EXCISION     back, fore arm     Allergies  Allergen Reactions  . Actonel [Risedronate] Other (See Comments)    Per patients mar  . Boniva [Ibandronic Acid] Other (See Comments)   Per patiens mar  . Fosamax [Alendronate] Other (See Comments)    Per patiens mar  . Miacalcin [Calcitonin] Other (See Comments)    Per patients mar  . Sulfa Antibiotics Itching      Medication List       Accurate as of 10/13/15 11:38 AM. Always use your most recent med list.          acetaminophen 500 MG tablet Commonly known as:  TYLENOL Take 1,000 mg by mouth 2 (two) times daily. DO NOT EXCEED 3GM IN 24 HOURS   omeprazole 20 MG capsule Commonly known as:  PRILOSEC Take 20 mg by mouth 2 (two) times daily before a meal.   senna 176 MG/5ML Syrp Commonly known as:  SENOKOT Take 10 mLs by mouth 2 (two) times daily. Mix in chocolate milk   Vitamin D 2000 units Caps Take 1 capsule (2,000 Units total) by mouth daily.       Immunization History  Administered Date(s) Administered  . Hepatitis A 11/21/2005  . Influenza, High Dose Seasonal PF 10/06/2013  . Influenza-Unspecified 10/28/2014  . Pneumococcal Conjugate-13 10/06/2013  . Pneumococcal Polysaccharide-23 12/13/1998  . Td 11/22/2004  . Typhoid Inactivated 07/02/2011  . Zoster 07/02/2011   Pertinent  Health Maintenance Due  Topic Date Due  . INFLUENZA VACCINE  08/16/2015  . PNA vac Low Risk Adult  Completed   Fall Risk  12/30/2014 05/27/2014  Falls in the past year? Yes Yes  Number falls in past yr: 2 or more 2 or more  Injury with Fall? No No  Risk Factor Category  High Fall Risk High Fall Risk  Risk for fall due to : History of fall(s);Impaired balance/gait History of fall(s)  Follow up Falls evaluation completed Falls evaluation completed    Vitals:   10/13/15 1108  BP: (!) 154/75  Pulse: 85  Resp: 20  Temp: 97.5 F (36.4 C)  SpO2: 99%  Weight: 119 lb 9.6 oz (54.3 kg)   Wt Readings from Last 3 Encounters:  10/13/15 119 lb 9.6 oz (54.3 kg)  09/08/15 (P) 119 lb (54 kg)  08/09/15 116 lb (52.6 kg)    Physical Exam  Constitutional: No distress.  HENT:  Head: Normocephalic and atraumatic.    Cardiovascular: Normal rate, regular rhythm and intact distal pulses.   No murmur heard. Pulmonary/Chest: Effort normal and breath sounds normal. No respiratory distress.  Abdominal: Soft. Bowel sounds are normal. She exhibits no distension. There is no tenderness.  Musculoskeletal: She exhibits no edema or tenderness.  Significant scoliosis  Neurological: She is alert. No cranial nerve deficit.  Minimally verbal, difficulty following commands. Tremor noted during exam at rest, worse with intention.  Has some rigidity to the upper ext as well at shoulder joints,  Normal ROM to elbows  Skin: Skin is warm and dry. She is not diaphoretic. No erythema. No pallor.  Psychiatric: She has a normal mood and affect.    Labs reviewed:  Recent Labs  01/04/15 06/03/15  NA 140 141  K 4.2 4.5  BUN 21 18  CREATININE 1.0 0.9   No results for input(s): AST, ALT, ALKPHOS, BILITOT, PROT, ALBUMIN in the last 8760 hours.  Recent Labs  01/04/15 06/03/15  WBC 5.8 6.0  HGB 9.2* 10.0*  HCT 28* 31*  PLT 434* 562*   No results found for: TSH Lab Results  Component Value Date   HGBA1C 5.8 (H) 01/19/2014   Lab Results  Component Value Date   CHOL 186 02/22/2014   HDL 57 02/22/2014   LDLCALC 112 02/22/2014   TRIG 86 02/22/2014   CHOLHDL 4.0 01/19/2014    Significant Diagnostic Results in last 30 days:  No results found.  Assessment/Plan  1. Falls frequently Due to dementia and weakness Bed/chair alarm in place Frequent reminders and orientation necessary  2. Tremor Noted but difficult to perform neuro exam as she did not follow commands. ? Parkinsonism Some rigidity noted as well but this could be from disuses of the shoulders. Given her goals of care would not explore diff dx further  3. Senile osteoporosis Family declined bone scans in the past even after education regarding the prevention of fractures Comfort care approach  4. Late onset Alzheimer's disease without behavioral  disturbance Worsening over time Supportive care, would not benefit from meds  5. Anemia, unspecified anemia type Stable, probably due to ACD Monitor CBC periodically  [name], [place] [phone]

## 2015-11-10 ENCOUNTER — Non-Acute Institutional Stay (SKILLED_NURSING_FACILITY): Payer: Medicare Other | Admitting: Adult Health

## 2015-11-10 ENCOUNTER — Encounter: Payer: Self-pay | Admitting: Adult Health

## 2015-11-10 DIAGNOSIS — G301 Alzheimer's disease with late onset: Secondary | ICD-10-CM

## 2015-11-10 DIAGNOSIS — K449 Diaphragmatic hernia without obstruction or gangrene: Secondary | ICD-10-CM | POA: Diagnosis not present

## 2015-11-10 DIAGNOSIS — K219 Gastro-esophageal reflux disease without esophagitis: Secondary | ICD-10-CM

## 2015-11-10 DIAGNOSIS — M4120 Other idiopathic scoliosis, site unspecified: Secondary | ICD-10-CM | POA: Diagnosis not present

## 2015-11-10 DIAGNOSIS — F0280 Dementia in other diseases classified elsewhere without behavioral disturbance: Secondary | ICD-10-CM | POA: Diagnosis not present

## 2015-11-10 DIAGNOSIS — K5901 Slow transit constipation: Secondary | ICD-10-CM | POA: Diagnosis not present

## 2015-11-19 NOTE — Progress Notes (Signed)
Patient ID: Tanya Leblanc, female   DOB: 12/22/1924, 82 y.o.   MRN: [id number]  Location:  [facility], SNF Provider:  Royal Hawthorn, NP   Code Status:  DNR Goals of care: Advanced Directive information Advanced Directives 10/13/2015  Does patient have an advance directive? Yes  Type of Paramedic of [place];Living will;Out of facility DNR (pink MOST or yellow form)  Does patient want to make changes to advanced directive? -  Copy of advanced directive(s) in chart? Yes  Pre-existing out of facility DNR order (yellow form or pink MOST form) -     Chief Complaint  Patient presents with  . Medical Management of Chronic Issues    HPI:  Pt is a 82 y.o. female seen today for medical management of chronic diseases.  Resident has a hx of dementia, anemia, constipation, gerd, hiatal hernia, and significant scoliosis.  VS stable, weight stable at 117 lb. Remains ambulatory with a walker. Has advanced dementia, MMSE 4/30 on 09/09/15.  Has a noted tremor to the right hand, stable from last visit.  No complaints today regarding her care.  Frequently falls due to attempts to get oob without help.  Normal BMs when staff is with her (sometimes she takes herself to the BR), currently on senokot. Hx of hiatal hernia and gerd, on prilosec 20 mg BID  Review of Systems  Unable to perform ROS: Dementia    Past Medical History:  Diagnosis Date  . Amnesia   . Breast CA ([place])   . Cancer ([place])   . Chronic kidney disease   . GERD (gastroesophageal reflux disease)   . Low back pain   . Melanoma ([place])   . Osteoporosis   . Scoliosis    Past Surgical History:  Procedure Laterality Date  . BREAST SURGERY     reconstructive  . CATARACT EXTRACTION Bilateral 2011   Dr. Kathrin Penner  . MASTECTOMY Left 1990  . MELANOMA EXCISION     back, fore arm     Allergies  Allergen Reactions  . Actonel [Risedronate] Other (See Comments)    Per patients mar    . Boniva [Ibandronic Acid] Other (See Comments)    Per patiens mar  . Fosamax [Alendronate] Other (See Comments)    Per patiens mar  . Miacalcin [Calcitonin] Other (See Comments)    Per patients mar  . Sulfa Antibiotics Itching      Medication List       Accurate as of 11/10/15 11:59 PM. Always use your most recent med list.          acetaminophen 500 MG tablet Commonly known as:  TYLENOL Take 1,000 mg by mouth 2 (two) times daily. DO NOT EXCEED 3GM IN 24 HOURS   omeprazole 20 MG capsule Commonly known as:  PRILOSEC Take 20 mg by mouth 2 (two) times daily before a meal.   senna 176 MG/5ML Syrp Commonly known as:  SENOKOT Take 10 mLs by mouth 2 (two) times daily. Mix in chocolate milk   Vitamin D 2000 units Caps Take 1 capsule (2,000 Units total) by mouth daily.       Immunization History  Administered Date(s) Administered  . Hepatitis A 11/21/2005  . Influenza Inj Mdck Quad Pf 11/03/2015  . Influenza, High Dose Seasonal PF 10/06/2013  . Influenza-Unspecified 10/28/2014  . Pneumococcal Conjugate-13 10/06/2013  . Pneumococcal Polysaccharide-23 12/13/1998  . Td 11/22/2004  . Typhoid Inactivated 07/02/2011  . Zoster 07/02/2011   Pertinent  Health Maintenance  Due  Topic Date Due  . INFLUENZA VACCINE  Completed  . PNA vac Low Risk Adult  Completed   Fall Risk  12/30/2014 05/27/2014  Falls in the past year? Yes Yes  Number falls in past yr: 2 or more 2 or more  Injury with Fall? No No  Risk Factor Category  High Fall Risk High Fall Risk  Risk for fall due to : History of fall(s);Impaired balance/gait History of fall(s)  Follow up Falls evaluation completed Falls evaluation completed    Vitals:   11/10/15 1459  BP: 136/74  Pulse: 93  Resp: 16  Temp: 97.2 F (36.2 C)  SpO2: 94%  Weight: 117 lb 3.2 oz (53.2 kg)   Wt Readings from Last 3 Encounters:  11/10/15 117 lb 3.2 oz (53.2 kg)  10/13/15 119 lb 9.6 oz (54.3 kg)  09/08/15 (P) 119 lb (54 kg)     Physical Exam  Constitutional: No distress.  HENT:  Head: Normocephalic and atraumatic.  Cardiovascular: Normal rate, regular rhythm and intact distal pulses.   No murmur heard. Pulmonary/Chest: Effort normal and breath sounds normal. No respiratory distress.  Abdominal: Soft. Bowel sounds are normal. She exhibits no distension. There is no tenderness.  Musculoskeletal: She exhibits no edema or tenderness.  Significant scoliosis  Neurological: She is alert. No cranial nerve deficit.  Minimally verbal, difficulty following commands. Tremor noted during exam at rest, worse with intention.  Has some rigidity to the upper ext as well at shoulder joints,  Normal ROM to elbows  Skin: Skin is warm and dry. She is not diaphoretic. No erythema. No pallor.  Psychiatric: She has a normal mood and affect.    Labs reviewed:  Recent Labs  01/04/15 06/03/15  NA 140 141  K 4.2 4.5  BUN 21 18  CREATININE 1.0 0.9   No results for input(s): AST, ALT, ALKPHOS, BILITOT, PROT, ALBUMIN in the last 8760 hours.  Recent Labs  01/04/15 06/03/15  WBC 5.8 6.0  HGB 9.2* 10.0*  HCT 28* 31*  PLT 434* 562*   No results found for: TSH Lab Results  Component Value Date   HGBA1C 5.8 (H) 01/19/2014   Lab Results  Component Value Date   CHOL 186 02/22/2014   HDL 57 02/22/2014   LDLCALC 112 02/22/2014   TRIG 86 02/22/2014   CHOLHDL 4.0 01/19/2014    Significant Diagnostic Results in last 30 days:  No results found.  Assessment/Plan  1. Late onset Alzheimer's disease without behavioral disturbance Less verbal over time, falls frequently Weight remains stable Would not benefit from memory meds Continue supportive care Goals of care are comfort based  2. Gastroesophageal reflux disease without esophagitis No symptoms reported Continue prilosec 20 mg BID  3. Hiatal hernia See above  4. Slow transit constipation Stable Continue senokot BID  5. Scoliosis (and kyphoscoliosis),  idiopathic Denies back pain, uses walker for ambulation Continue Tylenol 1 gram BID  [name], ANP [facility] [phone]

## 2015-12-16 ENCOUNTER — Encounter: Payer: Self-pay | Admitting: Adult Health

## 2015-12-16 ENCOUNTER — Non-Acute Institutional Stay (SKILLED_NURSING_FACILITY): Payer: Medicare Other | Admitting: Adult Health

## 2015-12-16 DIAGNOSIS — R600 Localized edema: Secondary | ICD-10-CM

## 2015-12-16 DIAGNOSIS — G301 Alzheimer's disease with late onset: Secondary | ICD-10-CM

## 2015-12-16 DIAGNOSIS — F0280 Dementia in other diseases classified elsewhere without behavioral disturbance: Secondary | ICD-10-CM

## 2015-12-16 DIAGNOSIS — R269 Unspecified abnormalities of gait and mobility: Secondary | ICD-10-CM | POA: Diagnosis not present

## 2015-12-16 DIAGNOSIS — D649 Anemia, unspecified: Secondary | ICD-10-CM | POA: Diagnosis not present

## 2015-12-16 DIAGNOSIS — K5901 Slow transit constipation: Secondary | ICD-10-CM | POA: Diagnosis not present

## 2015-12-16 NOTE — Progress Notes (Signed)
Patient ID: Tanya Leblanc, female   DOB: 12-03-1924, 82 y.o.   MRN: [id number]  Location:  [facility], SNF Provider:  Royal Hawthorn, NP   Code Status:  DNR Goals of care: Advanced Directive information Advanced Directives 12/16/2015  Does Patient Have a Medical Advance Directive? Yes  Type of Paramedic of [place];Out of facility DNR (pink MOST or yellow form)  Does patient want to make changes to medical advance directive? -  Copy of [place] in Chart? Yes  Pre-existing out of facility DNR order (yellow form or pink MOST form) -     Chief Complaint  Patient presents with  . Medical Management of Chronic Issues    HPI:  Pt is a 82 y.o. female seen today for medical management of chronic diseases.  Resident has a hx of dementia, anemia, constipation, gerd, hiatal hernia, and significant scoliosis.  VS stable, weight has increased by 4 lbs up to 121 lbs.  Has advanced dementia, MMSE 4/30 on 09/09/15.  Long standing edema in both feet with venous insuff, no sob or cp Mostly in the wheelchair at this time, previously was ambulating with a walker but fell frequently. Has some tremor to both hands, and rigidity to BUE.  Has not bee worked up for PD but to her advanced dementia and infrequently ambulation.   Staff report constipation on colace.  Needs prn meds to help with this problem.  Review of Systems  Unable to perform ROS: Dementia    Past Medical History:  Diagnosis Date  . Amnesia   . Breast CA ([place])   . Cancer ([place])   . Chronic kidney disease   . GERD (gastroesophageal reflux disease)   . Low back pain   . Melanoma ([place])   . Osteoporosis   . Scoliosis    Past Surgical History:  Procedure Laterality Date  . BREAST SURGERY     reconstructive  . CATARACT EXTRACTION Bilateral 2011   Dr. Kathrin Penner  . MASTECTOMY Left 1990  . MELANOMA EXCISION     back, fore arm     Allergies  Allergen Reactions   . Actonel [Risedronate] Other (See Comments)    Per patients mar  . Boniva [Ibandronic Acid] Other (See Comments)    Per patiens mar  . Fosamax [Alendronate] Other (See Comments)    Per patiens mar  . Miacalcin [Calcitonin] Other (See Comments)    Per patients mar  . Sulfa Antibiotics Itching      Medication List       Accurate as of 12/16/15 10:56 AM. Always use your most recent med list.          acetaminophen 500 MG tablet Commonly known as:  TYLENOL Take 1,000 mg by mouth 2 (two) times daily. DO NOT EXCEED 3GM IN 24 HOURS   omeprazole 20 MG capsule Commonly known as:  PRILOSEC Take 20 mg by mouth 2 (two) times daily before a meal.   senna 176 MG/5ML Syrp Commonly known as:  SENOKOT Take 10 mLs by mouth 2 (two) times daily. Mix in chocolate milk   Vitamin D 2000 units Caps Take 1 capsule (2,000 Units total) by mouth daily.       Immunization History  Administered Date(s) Administered  . Hepatitis A 11/21/2005  . Influenza Inj Mdck Quad Pf 11/03/2015  . Influenza, High Dose Seasonal PF 10/06/2013  . Influenza-Unspecified 10/28/2014  . Pneumococcal Conjugate-13 10/06/2013  . Pneumococcal Polysaccharide-23 12/13/1998  . Td 11/22/2004  .  Typhoid Inactivated 07/02/2011  . Zoster 07/02/2011   Pertinent  Health Maintenance Due  Topic Date Due  . INFLUENZA VACCINE  Completed  . PNA vac Low Risk Adult  Completed   Fall Risk  11/19/2015 12/30/2014 05/27/2014  Falls in the past year? Yes Yes Yes  Number falls in past yr: 2 or more 2 or more 2 or more  Injury with Fall? No No No  Risk Factor Category  High Fall Risk High Fall Risk High Fall Risk  Risk for fall due to : History of fall(s);Impaired balance/gait History of fall(s);Impaired balance/gait History of fall(s)  Follow up Falls evaluation completed Falls evaluation completed Falls evaluation completed    Vitals:   12/16/15 1049  Weight: 121 lb 12.8 oz (55.2 kg)   Wt Readings from Last 3 Encounters:    12/16/15 121 lb 12.8 oz (55.2 kg)  11/10/15 117 lb 3.2 oz (53.2 kg)  10/13/15 119 lb 9.6 oz (54.3 kg)    Physical Exam  Constitutional: No distress.  HENT:  Head: Normocephalic and atraumatic.  Cardiovascular: Normal rate, regular rhythm and intact distal pulses.   No murmur heard. Pulmonary/Chest: Effort normal and breath sounds normal. No respiratory distress.  Abdominal: Soft. Bowel sounds are normal. She exhibits no distension. There is no tenderness.  Musculoskeletal: She exhibits no edema or tenderness.  Significant scoliosis  Neurological: She is alert. No cranial nerve deficit.  Minimally verbal, difficulty following commands. Tremor noted during exam at rest, worse with intention.  Has some rigidity to the upper ext as well at shoulder joints,  Normal ROM to elbows  Skin: Skin is warm and dry. She is not diaphoretic. No erythema. No pallor.  Psychiatric: She has a normal mood and affect.    Labs reviewed:  Recent Labs  01/04/15 06/03/15  NA 140 141  K 4.2 4.5  BUN 21 18  CREATININE 1.0 0.9   No results for input(s): AST, ALT, ALKPHOS, BILITOT, PROT, ALBUMIN in the last 8760 hours.  Recent Labs  01/04/15 06/03/15  WBC 5.8 6.0  HGB 9.2* 10.0*  HCT 28* 31*  PLT 434* 562*   No results found for: TSH Lab Results  Component Value Date   HGBA1C 5.8 (H) 01/19/2014   Lab Results  Component Value Date   CHOL 186 02/22/2014   HDL 57 02/22/2014   LDLCALC 112 02/22/2014   TRIG 86 02/22/2014   CHOLHDL 4.0 01/19/2014    Significant Diagnostic Results in last 30 days:  No results found.  Assessment/Plan  1. Anemia, unspecified type Check CBC and B12 (due to prolong use of PPI with anemia)  2. Slow transit constipation D/C senna Senokot s 1 tab BID MOM 30 CC today  3. Late onset Alzheimer's disease without behavioral disturbance Worsening over time with decreased mobility and infrequent verbalizations Not on meds due to lack of benefit  4. Localized  edema Noted to bilat lower ext but no resp diff or crackles Poor fluid intake so would avoid diuretics unless this worsens Elevate legs  5. Gait disorder Due to advancing dementia and significant scoliosis Also has some  Parkinsonism symptoms that may be contributing to this issue Goals of care are comfort based     [name], [place] [phone]

## 2015-12-17 ENCOUNTER — Inpatient Hospital Stay (HOSPITAL_COMMUNITY)
Admission: EM | Admit: 2015-12-17 | Discharge: 2015-12-20 | DRG: 281 | Disposition: A | Discharge status: Skilled Nursing Facility | Payer: Medicare Other | Attending: Internal Medicine | Admitting: Internal Medicine

## 2015-12-17 ENCOUNTER — Emergency Department (HOSPITAL_COMMUNITY): Payer: Medicare Other

## 2015-12-17 ENCOUNTER — Encounter (HOSPITAL_COMMUNITY): Payer: Self-pay | Admitting: Emergency Medicine

## 2015-12-17 DIAGNOSIS — N179 Acute kidney failure, unspecified: Secondary | ICD-10-CM | POA: Diagnosis present

## 2015-12-17 DIAGNOSIS — I10 Essential (primary) hypertension: Secondary | ICD-10-CM | POA: Diagnosis not present

## 2015-12-17 DIAGNOSIS — Z85820 Personal history of malignant melanoma of skin: Secondary | ICD-10-CM | POA: Diagnosis not present

## 2015-12-17 DIAGNOSIS — Z882 Allergy status to sulfonamides status: Secondary | ICD-10-CM

## 2015-12-17 DIAGNOSIS — I447 Left bundle-branch block, unspecified: Secondary | ICD-10-CM | POA: Diagnosis present

## 2015-12-17 DIAGNOSIS — I2129 ST elevation (STEMI) myocardial infarction involving other sites: Secondary | ICD-10-CM | POA: Diagnosis not present

## 2015-12-17 DIAGNOSIS — I2109 ST elevation (STEMI) myocardial infarction involving other coronary artery of anterior wall: Secondary | ICD-10-CM | POA: Diagnosis present

## 2015-12-17 DIAGNOSIS — E785 Hyperlipidemia, unspecified: Secondary | ICD-10-CM | POA: Diagnosis present

## 2015-12-17 DIAGNOSIS — F0390 Unspecified dementia without behavioral disturbance: Secondary | ICD-10-CM

## 2015-12-17 DIAGNOSIS — Z9841 Cataract extraction status, right eye: Secondary | ICD-10-CM

## 2015-12-17 DIAGNOSIS — G301 Alzheimer's disease with late onset: Secondary | ICD-10-CM | POA: Diagnosis not present

## 2015-12-17 DIAGNOSIS — M545 Low back pain: Secondary | ICD-10-CM | POA: Diagnosis present

## 2015-12-17 DIAGNOSIS — K449 Diaphragmatic hernia without obstruction or gangrene: Secondary | ICD-10-CM | POA: Diagnosis present

## 2015-12-17 DIAGNOSIS — Z888 Allergy status to other drugs, medicaments and biological substances status: Secondary | ICD-10-CM

## 2015-12-17 DIAGNOSIS — Z8673 Personal history of transient ischemic attack (TIA), and cerebral infarction without residual deficits: Secondary | ICD-10-CM | POA: Diagnosis not present

## 2015-12-17 DIAGNOSIS — R0989 Other specified symptoms and signs involving the circulatory and respiratory systems: Secondary | ICD-10-CM

## 2015-12-17 DIAGNOSIS — F03C0 Unspecified dementia, severe, without behavioral disturbance, psychotic disturbance, mood disturbance, and anxiety: Secondary | ICD-10-CM

## 2015-12-17 DIAGNOSIS — G309 Alzheimer's disease, unspecified: Secondary | ICD-10-CM | POA: Diagnosis present

## 2015-12-17 DIAGNOSIS — Z9842 Cataract extraction status, left eye: Secondary | ICD-10-CM

## 2015-12-17 DIAGNOSIS — M419 Scoliosis, unspecified: Secondary | ICD-10-CM | POA: Diagnosis present

## 2015-12-17 DIAGNOSIS — Z9012 Acquired absence of left breast and nipple: Secondary | ICD-10-CM

## 2015-12-17 DIAGNOSIS — I213 ST elevation (STEMI) myocardial infarction of unspecified site: Secondary | ICD-10-CM | POA: Diagnosis not present

## 2015-12-17 DIAGNOSIS — M810 Age-related osteoporosis without current pathological fracture: Secondary | ICD-10-CM | POA: Diagnosis present

## 2015-12-17 DIAGNOSIS — N182 Chronic kidney disease, stage 2 (mild): Secondary | ICD-10-CM | POA: Diagnosis present

## 2015-12-17 DIAGNOSIS — D508 Other iron deficiency anemias: Secondary | ICD-10-CM | POA: Diagnosis not present

## 2015-12-17 DIAGNOSIS — E876 Hypokalemia: Secondary | ICD-10-CM

## 2015-12-17 DIAGNOSIS — I2102 ST elevation (STEMI) myocardial infarction involving left anterior descending coronary artery: Secondary | ICD-10-CM | POA: Diagnosis not present

## 2015-12-17 DIAGNOSIS — Z7189 Other specified counseling: Secondary | ICD-10-CM

## 2015-12-17 DIAGNOSIS — K219 Gastro-esophageal reflux disease without esophagitis: Secondary | ICD-10-CM | POA: Diagnosis present

## 2015-12-17 DIAGNOSIS — D638 Anemia in other chronic diseases classified elsewhere: Secondary | ICD-10-CM | POA: Diagnosis present

## 2015-12-17 DIAGNOSIS — F0280 Dementia in other diseases classified elsewhere without behavioral disturbance: Secondary | ICD-10-CM | POA: Diagnosis present

## 2015-12-17 DIAGNOSIS — D649 Anemia, unspecified: Secondary | ICD-10-CM | POA: Diagnosis present

## 2015-12-17 DIAGNOSIS — R296 Repeated falls: Secondary | ICD-10-CM | POA: Diagnosis present

## 2015-12-17 DIAGNOSIS — H9190 Unspecified hearing loss, unspecified ear: Secondary | ICD-10-CM

## 2015-12-17 DIAGNOSIS — R079 Chest pain, unspecified: Secondary | ICD-10-CM | POA: Diagnosis present

## 2015-12-17 DIAGNOSIS — I5032 Chronic diastolic (congestive) heart failure: Secondary | ICD-10-CM | POA: Diagnosis present

## 2015-12-17 DIAGNOSIS — Z66 Do not resuscitate: Secondary | ICD-10-CM | POA: Diagnosis present

## 2015-12-17 DIAGNOSIS — Z515 Encounter for palliative care: Secondary | ICD-10-CM

## 2015-12-17 DIAGNOSIS — Z853 Personal history of malignant neoplasm of breast: Secondary | ICD-10-CM | POA: Diagnosis not present

## 2015-12-17 HISTORY — DX: Unspecified dementia, unspecified severity, without behavioral disturbance, psychotic disturbance, mood disturbance, and anxiety: F03.90

## 2015-12-17 HISTORY — DX: Unspecified hearing loss, unspecified ear: H91.90

## 2015-12-17 LAB — POC OCCULT BLOOD, ED: Fecal Occult Bld: NEGATIVE

## 2015-12-17 LAB — DIFFERENTIAL
BASOS ABS: 0.1 10*3/uL (ref 0.0–0.1)
BASOS PCT: 1 %
EOS PCT: 0 %
Eosinophils Absolute: 0 10*3/uL (ref 0.0–0.7)
LYMPHS ABS: 1 10*3/uL (ref 0.7–4.0)
Lymphocytes Relative: 12 %
MONOS PCT: 5 %
Monocytes Absolute: 0.4 10*3/uL (ref 0.1–1.0)
NEUTROS ABS: 7 10*3/uL (ref 1.7–7.7)
Neutrophils Relative %: 82 %

## 2015-12-17 LAB — COMPREHENSIVE METABOLIC PANEL
ALBUMIN: 3.5 g/dL (ref 3.5–5.0)
ALT: 14 U/L (ref 14–54)
AST: 24 U/L (ref 15–41)
Alkaline Phosphatase: 54 U/L (ref 38–126)
Anion gap: 11 (ref 5–15)
BILIRUBIN TOTAL: 0.2 mg/dL — AB (ref 0.3–1.2)
BUN: 22 mg/dL — AB (ref 6–20)
CHLORIDE: 106 mmol/L (ref 101–111)
CO2: 20 mmol/L — ABNORMAL LOW (ref 22–32)
CREATININE: 1.11 mg/dL — AB (ref 0.44–1.00)
Calcium: 9 mg/dL (ref 8.9–10.3)
GFR calc Af Amer: 50 mL/min — ABNORMAL LOW (ref >60)
GFR calc non Af Amer: 43 mL/min — ABNORMAL LOW (ref >60)
GLUCOSE: 210 mg/dL — AB (ref 65–99)
POTASSIUM: 4.1 mmol/L (ref 3.5–5.1)
Sodium: 137 mmol/L (ref 135–145)
TOTAL PROTEIN: 6.2 g/dL — AB (ref 6.5–8.1)

## 2015-12-17 LAB — CBC
HEMATOCRIT: 24.7 % — AB (ref 36.0–46.0)
Hemoglobin: 7.4 g/dL — ABNORMAL LOW (ref 12.0–15.0)
MCH: 22.2 pg — AB (ref 26.0–34.0)
MCHC: 30 g/dL (ref 30.0–36.0)
MCV: 74.2 fL — AB (ref 78.0–100.0)
Platelets: 516 10*3/uL — ABNORMAL HIGH (ref 150–400)
RBC: 3.33 MIL/uL — AB (ref 3.87–5.11)
RDW: 16.2 % — AB (ref 11.5–15.5)
WBC: 8.5 10*3/uL (ref 4.0–10.5)

## 2015-12-17 LAB — LIPID PANEL
CHOL/HDL RATIO: 4.1 ratio
Cholesterol: 188 mg/dL (ref 0–200)
HDL: 46 mg/dL (ref >40)
LDL CALC: 126 mg/dL — AB (ref 0–99)
Triglycerides: 82 mg/dL (ref <150)
VLDL: 16 mg/dL (ref 0–40)

## 2015-12-17 LAB — HEPARIN LEVEL (UNFRACTIONATED)
HEPARIN UNFRACTIONATED: 0.92 [IU]/mL — AB (ref 0.30–0.70)
Heparin Unfractionated: 0.64 IU/mL (ref 0.30–0.70)

## 2015-12-17 LAB — APTT: aPTT: 24 seconds (ref 24–36)

## 2015-12-17 LAB — PROTIME-INR
INR: 1.03
Prothrombin Time: 13.6 seconds (ref 11.4–15.2)

## 2015-12-17 LAB — TROPONIN I: Troponin I: 0.21 ng/mL (ref <0.03)

## 2015-12-17 LAB — ABO/RH: ABO/RH(D): O POS

## 2015-12-17 MED ORDER — HEPARIN SODIUM (PORCINE) 5000 UNIT/ML IJ SOLN: 60.0000 [IU]/kg | INTRAMUSCULAR | Status: DC

## 2015-12-17 MED ORDER — HEPARIN (PORCINE) IN NACL 100-0.45 UNIT/ML-% IJ SOLN
550.0000 [IU]/h | INTRAMUSCULAR | Status: DC
  Administered 2015-12-17: 550 [IU]/h via INTRAVENOUS
  Filled 2015-12-17: qty 250

## 2015-12-17 MED ORDER — ONDANSETRON HCL 4 MG/2ML IJ SOLN: 4.0000 mg | Freq: Four times a day (QID) | INTRAMUSCULAR | Status: DC | PRN

## 2015-12-17 MED ORDER — HEPARIN (PORCINE) IN NACL 100-0.45 UNIT/ML-% IJ SOLN
650.0000 [IU]/h | INTRAMUSCULAR | Status: DC
  Administered 2015-12-17: 650 [IU]/h via INTRAVENOUS
  Filled 2015-12-17: qty 250

## 2015-12-17 MED ORDER — CLOPIDOGREL BISULFATE 75 MG PO TABS: 300.0000 mg | ORAL_TABLET | Freq: Once | ORAL | Status: DC

## 2015-12-17 MED ORDER — SODIUM CHLORIDE 0.9 % IV SOLN
10.0000 mL/h | INTRAVENOUS | Status: DC
  Administered 2015-12-17: 10 mL/h via INTRAVENOUS

## 2015-12-17 MED ORDER — MORPHINE SULFATE (PF) 4 MG/ML IV SOLN
4.0000 mg | Freq: Once | INTRAVENOUS | Status: AC
  Administered 2015-12-17: 4 mg via INTRAVENOUS

## 2015-12-17 MED ORDER — HEPARIN BOLUS VIA INFUSION
3000.0000 [IU] | Freq: Once | INTRAVENOUS | Status: AC
  Administered 2015-12-17: 3000 [IU] via INTRAVENOUS
  Filled 2015-12-17: qty 3000

## 2015-12-17 MED ORDER — MORPHINE SULFATE (PF) 4 MG/ML IV SOLN: 2.0000 mg | Freq: Once | INTRAVENOUS | Status: DC

## 2015-12-17 MED ORDER — GUAIFENESIN 100 MG/5ML PO SOLN: 5.0000 mL | ORAL | Status: DC | PRN

## 2015-12-17 MED ORDER — POTASSIUM CHLORIDE CRYS ER 20 MEQ PO TBCR
20.0000 meq | EXTENDED_RELEASE_TABLET | Freq: Two times a day (BID) | ORAL | Status: DC
  Administered 2015-12-17 – 2015-12-18 (×3): 20 meq via ORAL
  Filled 2015-12-17 (×4): qty 1

## 2015-12-17 MED ORDER — METOPROLOL TARTRATE 25 MG PO TABS
25.0000 mg | ORAL_TABLET | Freq: Two times a day (BID) | ORAL | Status: DC
  Administered 2015-12-17 – 2015-12-20 (×4): 25 mg via ORAL
  Filled 2015-12-17 (×6): qty 1

## 2015-12-17 MED ORDER — PANTOPRAZOLE SODIUM 40 MG PO TBEC
40.0000 mg | DELAYED_RELEASE_TABLET | Freq: Every day | ORAL | Status: DC
  Administered 2015-12-18 – 2015-12-20 (×2): 40 mg via ORAL
  Filled 2015-12-17 (×4): qty 1

## 2015-12-17 MED ORDER — CAPTOPRIL 6.25 MG HALF TABLET
6.2500 mg | ORAL_TABLET | Freq: Three times a day (TID) | ORAL | Status: DC
  Administered 2015-12-17: 6.25 mg via ORAL
  Filled 2015-12-17 (×4): qty 1

## 2015-12-17 MED ORDER — FUROSEMIDE 10 MG/ML IJ SOLN
40.0000 mg | Freq: Every day | INTRAMUSCULAR | Status: DC
  Administered 2015-12-17: 40 mg via INTRAVENOUS
  Filled 2015-12-17: qty 4

## 2015-12-17 NOTE — ED Notes (Signed)
Attempted report 

## 2015-12-17 NOTE — Progress Notes (Signed)
ANTICOAGULATION CONSULT NOTE - Initial Consult  Pharmacy Consult for heparin Indication: chest pain/ACS  Allergies  Allergen Reactions  . Actonel [Risedronate] Other (See Comments)    Per patients mar  . Boniva [Ibandronic Acid] Other (See Comments)    Per patiens mar  . Fosamax [Alendronate] Other (See Comments)    Per patiens mar  . Miacalcin [Calcitonin] Other (See Comments)    Per patients mar  . Sulfa Antibiotics Itching    Patient Measurements: Height: 5' (152.4 cm) Weight: 121 lb 11.1 oz (55.2 kg) IBW/kg (Calculated) : 45.5   Medical History: Past Medical History:  Diagnosis Date  . Amnesia   . Breast CA ([place])   . Cancer ([place])   . Chronic kidney disease   . GERD (gastroesophageal reflux disease)   . Low back pain   . Melanoma ([place])   . Osteoporosis   . Scoliosis      Assessment: 82yo female presents as code STEMI that was subsequently canceled, to begin heparin for CP.  Goal of Therapy:  Heparin level 0.3-0.7 units/ml Monitor platelets by anticoagulation protocol: Yes   Plan:  Will give heparin 3000 units IV bolus x1 followed by gtt at 650 units/hr and monitor heparin levels and CBC.  Wynona Neat, PharmD, BCPS  12/17/2015,2:13 AM

## 2015-12-17 NOTE — ED Triage Notes (Addendum)
Pt arrives from  [facility] as a code stemi for chest pain, arm pain and leg pain. New BBB. Pt HOH. Stemi canceled upon her arrival.  Pt received 4MG  zofran, 2 SL nitro and 324 MG aspirin from EMS.

## 2015-12-17 NOTE — Progress Notes (Signed)
Patient Name: Tanya Leblanc Date of Encounter: 12/17/2015  Primary Cardiologist: None  Hospital Problem List     Active Problems:   STEMI (ST elevation myocardial infarction) ([place])     Subjective   Resting comfortably.  Inpatient Medications    Scheduled Meds: . captopril  6.25 mg Oral TID  . clopidogrel  300 mg Oral Once  . metoprolol tartrate  25 mg Oral BID  .  morphine injection  2 mg Intravenous Once  . pantoprazole  40 mg Oral Daily   Continuous Infusions: . sodium chloride 10 mL/hr (12/17/15 0233)  . heparin 650 Units/hr (12/17/15 0232)   PRN Meds: ondansetron (ZOFRAN) IV   Vital Signs    Vitals:   12/17/15 0300 12/17/15 0315 12/17/15 0345 12/17/15 0430  BP: 137/80 137/74 135/79   Pulse: 102 101 100   Resp: 21 13    SpO2: 96% 96% 95%   Weight:    122 lb 14.4 oz (55.7 kg)  Height:    4\' 11"  (1.499 m)   No intake or output data in the 24 hours ending 12/17/15 0822 Filed Weights   12/17/15 0200 12/17/15 0430  Weight: 121 lb 11.1 oz (55.2 kg) 122 lb 14.4 oz (55.7 kg)    Physical Exam    GEN: Well nourished, well developed, in no acute distress.  HEENT: Grossly normal.  Neck: Supple, no JVD, carotid bruits, or masses. Cardiac: Tachycardic, no murmurs, rubs, or gallops. No clubbing, cyanosis, edema.   Respiratory:  Respirations regular and unlabored, clear to auscultation bilaterally. GI: Soft, nontender, nondistended MS: no deformity or atrophy. Skin: warm and dry, no rash.   Labs    CBC  Recent Labs  12/17/15 0229  WBC 8.5  NEUTROABS 7.0  HGB 7.4*  HCT 24.7*  MCV 74.2*  PLT 99991111*   Basic Metabolic Panel  Recent Labs  12/17/15 0229  NA 137  K 4.1  CL 106  CO2 20*  GLUCOSE 210*  BUN 22*  CREATININE 1.11*  CALCIUM 9.0   Liver Function Tests  Recent Labs  12/17/15 0229  AST 24  ALT 14  ALKPHOS 54  BILITOT 0.2*  PROT 6.2*  ALBUMIN 3.5   No results for input(s): LIPASE, AMYLASE in the last 72 hours. Cardiac  Enzymes  Recent Labs  12/17/15 0229  TROPONINI 0.21*   BNP Invalid input(s): POCBNP D-Dimer No results for input(s): DDIMER in the last 72 hours. Hemoglobin A1C No results for input(s): HGBA1C in the last 72 hours. Fasting Lipid Panel  Recent Labs  12/17/15 0229  CHOL 188  HDL 46  LDLCALC 126*  TRIG 82  CHOLHDL 4.1   Thyroid Function Tests No results for input(s): TSH, T4TOTAL, T3FREE, THYROIDAB in the last 72 hours.  Invalid input(s): FREET3  Telemetry    Personally Reviewed: Sinus tach  ECG      Radiology    Dg Chest Port 1 View  Result Date: 12/17/2015 CLINICAL DATA:  Acute onset of generalized chest pain. Initial encounter. EXAM: PORTABLE CHEST 1 VIEW COMPARISON:  Chest radiograph performed 01/19/2014 FINDINGS: Vascular congestion is noted. Left basilar and bilateral perihilar airspace opacification may reflect pneumonia or possibly pulmonary edema. A small left pleural effusion is suspected. No pneumothorax is seen. The cardiomediastinal silhouette is borderline enlarged. No acute osseous abnormalities are seen. Clips are noted overlying the left axilla. IMPRESSION: Vascular congestion and borderline cardiomegaly. Left basilar and bilateral perihilar airspace opacification may reflect pneumonia or possibly pulmonary edema. Suspect small left  pleural effusion. Electronically Signed   By: Garald Balding M.D.   On: 12/17/2015 03:09    Cardiac Studies     Patient Profile   21yoF with advanced dementia and cancer, admitted with STEMI  Assessment & Plan    1. STEMI - treat medically with IV heparin - asa, plavix loaded - metoprolol 25 bid - captopril 6.25 tid (up titrate as tolerated) - statin - treat pain with IV morphine - zofran prn for nausea  2. Continue home meds  NPO while nauseated.  Can advance diet today.  DNR  Signed, Kate Sable, MD  12/17/2015, 8:22 AM

## 2015-12-17 NOTE — H&P (Cosign Needed)
Cc: STEMI  HPI:  61yoF with hx of advanced dementia (MMSE 4/30), melanoma and breast cancer, CKD, presents with acute STEMI.  Patient resides at a nursing home and was noted to have severe chest pain that started at 12 am tonight.  Nurses at her nursing home called EMS.  She received full dose ASA and 2 NTG with some relief in pain.  ECG shows new RBBB and LAFB consistent with anterior stemi.  Patient has advanced directive with DNR order.      Past Medical History:  Diagnosis Date  . Amnesia   . Breast CA ([place])   . Cancer ([place])   . Chronic kidney disease   . GERD (gastroesophageal reflux disease)   . Low back pain   . Melanoma ([place])   . Osteoporosis   . Scoliosis    Meds No current facility-administered medications on file prior to encounter.    Current Outpatient Prescriptions on File Prior to Encounter  Medication Sig Dispense Refill  . acetaminophen (TYLENOL) 500 MG tablet Take 1,000 mg by mouth 2 (two) times daily. DO NOT EXCEED 3GM IN 24 HOURS    . Cholecalciferol (VITAMIN D) 2000 UNITS CAPS Take 1 capsule (2,000 Units total) by mouth daily. 30 capsule 3  . omeprazole (PRILOSEC) 20 MG capsule Take 20 mg by mouth 2 (two) times daily before a meal.    . senna (SENOKOT) 176 MG/5ML SYRP Take 10 mLs by mouth 2 (two) times daily. Mix in chocolate milk     Allergy: Allergies  Allergen Reactions  . Actonel [Risedronate] Other (See Comments)    Per patients mar  . Boniva [Ibandronic Acid] Other (See Comments)    Per patiens mar  . Fosamax [Alendronate] Other (See Comments)    Per patiens mar  . Miacalcin [Calcitonin] Other (See Comments)    Per patients mar  . Sulfa Antibiotics Itching   Soc Hx: Social History   Social History  . Marital status: Married    Spouse name: N/A  . Number of children: N/A  . Years of education: N/A   Occupational History  . retired Agricultural engineer    Social History Main Topics  . Smoking status: Never Smoker  .  Smokeless tobacco: Never Used  . Alcohol use Yes     Comment: 0.5 Glass a day  . Drug use: No  . Sexual activity: Not on file   Other Topics Concern  . Not on file   Social History Narrative   Lives at [facility] -skill unit   Never smoked   Exercise -yoga twice a week   POA/DNR/Living Will   Alcohol one glass of red wine daily         Family History  Problem Relation Age of Onset  . Myasthenia gravis Father    Exam Ht 5' (1.524 m)   Wt 55.2 kg (121 lb 11.1 oz)   BMI 23.77 kg/m  89yoF very hard of hearing, in NAD JVP normal CTAB RRR s1 s2 no mrg Soft nt nd No edema, warm well perfused Awake, not interactive (very hard of hearing), makes some eye contact  Labs: Pending  ECG As per HPI: sinus tach with new RBBB and LAFB, STE in V2-V5, subtle reciprocal depression in II, III, AVF  A/P 89yoF with advanced dementia and cancer, admitted with STEMI  1. STEMI - treat medically with IV heparin - asa, plavix load - metoprolol 25 bid - captopril 6.25 tid (up titrate as tolerated) - statin -  treat pain with IV morphine - admit to SDU for assessment and monitoring of pain, titration of morphine gtt if continued pain, telemetry - zofran prn for nausea  2. Continue home meds  NPO while nauseated.  Can advance diet in AM  DNAR  Terressa Koyanagi, MD Cardiology

## 2015-12-17 NOTE — Progress Notes (Signed)
ANTICOAGULATION CONSULT NOTE - Follow Up Consult  Pharmacy Consult for Heparin Indication: STEMI - medical management  Allergies  Allergen Reactions  . Actonel [Risedronate] Other (See Comments)    Per patients mar  . Boniva [Ibandronic Acid] Other (See Comments)    Per patiens mar  . Fosamax [Alendronate] Other (See Comments)    Per patiens mar  . Miacalcin [Calcitonin] Other (See Comments)    Per patients mar  . Sulfa Antibiotics Itching    Patient Measurements: Height: 4\' 11"  (149.9 cm) Weight: 122 lb 14.4 oz (55.7 kg) IBW/kg (Calculated) : 43.2 Heparin Dosing Weight: 54 kg  Vital Signs: Temp: 97.5 F (36.4 C) (12/02 1419) Temp Source: Oral (12/02 1419) Pulse Rate: 103 (12/02 1419)  Labs:  Recent Labs  12/17/15 0229 12/17/15 1045 12/17/15 1805  HGB 7.4*  --   --   HCT 24.7*  --   --   PLT 516*  --   --   APTT 24  --   --   LABPROT 13.6  --   --   INR 1.03  --   --   HEPARINUNFRC  --  0.92* 0.64  CREATININE 1.11*  --   --   TROPONINI 0.21*  --   --     Estimated Creatinine Clearance: 26.1 mL/min (by C-G formula based on SCr of 1.11 mg/dL (H)).   Medications:  Scheduled:  . captopril  6.25 mg Oral TID  . clopidogrel  300 mg Oral Once  . furosemide  40 mg Intravenous Daily  . metoprolol tartrate  25 mg Oral BID  .  morphine injection  2 mg Intravenous Once  . pantoprazole  40 mg Oral Daily  . potassium chloride  20 mEq Oral BID   Infusions:  . sodium chloride 10 mL/hr (12/17/15 0233)  . heparin 550 Units/hr (12/17/15 1248)    Assessment: 82 yo F presents as code STEMI that was subsequently canceled.  Plan for medical mgmt.  Heparin level is now therapeutic after dose adjustment. No bleeding noted.   Goal of Therapy:  Heparin level 0.3-0.7 units/ml Monitor platelets by anticoagulation protocol: Yes   Plan:  Continue heparin at 550 units/hr Daily heparin level and CBC  Salome Arnt, PharmD, BCPS Pager # [phone] 12/17/2015 7:31 PM

## 2015-12-17 NOTE — ED Provider Notes (Signed)
[facility] DEPT Provider Note   CSN: [id number] Arrival date & time: 12/17/15  0203 By signing my name below, I, Georgette Shell, attest that this documentation has been prepared under the direction and in the presence of Ezequiel Essex, MD. Electronically Signed: Georgette Shell, ED Scribe. 12/17/15. 2:14 AM.  History   Chief Complaint Chief Complaint  Patient presents with  . Chest Pain   LEVEL 5 CAVEAT: HPI and ROS limited due to dementia.  HPI Comments: Tanya Leblanc is a 82 y.o. female with h/o cancer, CKD, and dementia who presents to the Emergency Department by EMS from [facility] complaining of chest pain radiating to BUE onset just PTA with associated vomiting. EMS reports that pt was also complaining of BLE pain. Pt was given Zofran, 2 NTG, and 324 Aspirin en route with moderate relief. Denies fever, abdominal pain, back pain, or any other associated symptoms.   The history is provided by the patient and the EMS personnel. No language interpreter was used.    Past Medical History:  Diagnosis Date  . Amnesia   . Breast CA ([place])   . Cancer ([place])   . Chronic kidney disease   . GERD (gastroesophageal reflux disease)   . Low back pain   . Melanoma ([place])   . Osteoporosis   . Scoliosis     Patient Active Problem List   Diagnosis Date Noted  . Falls frequently 10/13/2015  . GERD (gastroesophageal reflux disease) 09/11/2015  . Tremor 09/11/2015  . Hiatal hernia 06/02/2015  . Alzheimer's disease 02/03/2015  . Constipation 09/23/2014  . Scoliosis (and kyphoscoliosis), idiopathic 07/23/2014  . Anemia 05/27/2014  . Senile osteoporosis 02/23/2014  . Hard of hearing 02/23/2014  . Hyperlipidemia 02/22/2014  . TIA (transient ischemic attack) 01/19/2014    Past Surgical History:  Procedure Laterality Date  . BREAST SURGERY     reconstructive  . CATARACT EXTRACTION Bilateral 2011   Dr. Kathrin Penner  . MASTECTOMY Left 1990  . MELANOMA EXCISION     back, fore arm      OB History    No data available       Home Medications    Prior to Admission medications   Medication Sig Start Date End Date Taking? Authorizing Provider  acetaminophen (TYLENOL) 500 MG tablet Take 1,000 mg by mouth 2 (two) times daily. DO NOT EXCEED 3GM IN 24 HOURS    Historical Provider, MD  Cholecalciferol (VITAMIN D) 2000 UNITS CAPS Take 1 capsule (2,000 Units total) by mouth daily. 08/10/14   Tiffany L Reed, DO  omeprazole (PRILOSEC) 20 MG capsule Take 20 mg by mouth 2 (two) times daily before a meal.    Historical Provider, MD  senna (SENOKOT) 176 MG/5ML SYRP Take 10 mLs by mouth 2 (two) times daily. Mix in chocolate milk    Historical Provider, MD    Family History Family History  Problem Relation Age of Onset  . Myasthenia gravis Father     Social History Social History  Substance Use Topics  . Smoking status: Never Smoker  . Smokeless tobacco: Never Used  . Alcohol use Yes     Comment: 0.5 Glass a day     Allergies   Actonel [risedronate]; Boniva [ibandronic acid]; Fosamax [alendronate]; Miacalcin [calcitonin]; and Sulfa antibiotics   Review of Systems Review of Systems  Unable to perform ROS: Dementia   Physical Exam Updated Vital Signs BP 135/79   Pulse 100   Resp 13   Ht 4\' 11"  (1.499 m)  Wt 122 lb 14.4 oz (55.7 kg)   SpO2 95%   BMI 24.82 kg/m   Physical Exam  Constitutional: She is oriented to person, place, and time. She appears well-developed and well-nourished. No distress.  Appears comfortable.  HENT:  Head: Normocephalic and atraumatic.  Mouth/Throat: Oropharynx is clear and moist. No oropharyngeal exudate.  Eyes: Conjunctivae and EOM are normal. Pupils are equal, round, and reactive to light.  Neck: Normal range of motion. Neck supple.  No meningismus.  Cardiovascular: Normal rate, regular rhythm, normal heart sounds and intact distal pulses.  Exam reveals no gallop and no friction rub.   No murmur heard. Pulmonary/Chest: Effort  normal and breath sounds normal. No respiratory distress. She has no wheezes. She has no rales.  Abdominal: Soft. There is no tenderness. There is no rebound and no guarding.  Musculoskeletal: Normal range of motion. She exhibits no edema or tenderness.  Equal radial pulses.  Neurological: She is alert and oriented to person, place, and time. No cranial nerve deficit. She exhibits normal muscle tone. Coordination normal.   5/5 strength throughout. CN 2-12 intact.Equal grip strength.   Skin: Skin is warm.  Psychiatric: She has a normal mood and affect. Her behavior is normal.  Nursing note and vitals reviewed.    ED Treatments / Results  DIAGNOSTIC STUDIES: Oxygen Saturation is 95% on [place] 2L, normal by my interpretation.    COORDINATION OF CARE: 2:08 AM Discussed treatment plan with pt at bedside.  Labs (all labs ordered are listed, but only abnormal results are displayed) Labs Reviewed  CBC - Abnormal; Notable for the following:       Result Value   RBC 3.33 (*)    Hemoglobin 7.4 (*)    HCT 24.7 (*)    MCV 74.2 (*)    MCH 22.2 (*)    RDW 16.2 (*)    Platelets 516 (*)    All other components within normal limits  COMPREHENSIVE METABOLIC PANEL - Abnormal; Notable for the following:    CO2 20 (*)    Glucose, Bld 210 (*)    BUN 22 (*)    Creatinine, Ser 1.11 (*)    Total Protein 6.2 (*)    Total Bilirubin 0.2 (*)    GFR calc non Af Amer 43 (*)    GFR calc Af Amer 50 (*)    All other components within normal limits  TROPONIN I - Abnormal; Notable for the following:    Troponin I 0.21 (*)    All other components within normal limits  LIPID PANEL - Abnormal; Notable for the following:    LDL Cholesterol 126 (*)    All other components within normal limits  DIFFERENTIAL  PROTIME-INR  APTT  HEPARIN LEVEL (UNFRACTIONATED)  POC OCCULT BLOOD, ED  TYPE AND SCREEN  ABO/RH    EKG  EKG Interpretation None       Radiology No results found.  Procedures Procedures  (including critical care time)  Medications Ordered in ED Medications  0.9 %  sodium chloride infusion (10 mL/hr Intravenous Transfusing/Transfer 12/17/15 0327)  morphine 4 MG/ML injection 2 mg (0 mg Intravenous Hold 12/17/15 0225)  heparin ADULT infusion 100 units/mL (25000 units/231mL sodium chloride 0.45%) (650 Units/hr Intravenous Transfusing/Transfer 12/17/15 0327)  metoprolol tartrate (LOPRESSOR) tablet 25 mg (0 mg Oral Hold 12/17/15 0326)  clopidogrel (PLAVIX) tablet 300 mg (0 mg Oral Hold 12/17/15 0327)  captopril (CAPOTEN) tablet 6.25 mg (not administered)  ondansetron (ZOFRAN) injection 4 mg (not administered)  pantoprazole (PROTONIX) EC tablet 40 mg (not administered)  heparin bolus via infusion 3,000 Units (3,000 Units Intravenous Bolus from Bag 12/17/15 0233)  morphine 4 MG/ML injection 4 mg (4 mg Intravenous Given 12/17/15 0223)     Initial Impression / Assessment and Plan / ED Course  I have reviewed the triage vital signs and the nursing notes.  Pertinent labs & imaging results that were available during my care of the patient were reviewed by me and considered in my medical decision making (see chart for details).  Clinical Course    Level V caveat for dementia. Patient from nursing facility with chest pain and ST elevation on EKG with new right bundle branch block. Code STEMI activated by EMS. Patient very hard of hearing.  Patient seen by cardiology on arrival Dr. Karlyn Agee. He has discussed with Dr. Gwenlyn Found and the patient's daughter. Given patient's DO NOT RESUSCITATE status, dementia and advanced age they have elected not to proceed with cardiac catheterization. Code STEMI was cancelled.  Troponin elevated. Hemoglobin has dropped 2 grams. FOBT negative.  Patient given aspirin, heparin, morphine, nitroglycerin. Continue heparin gtt with negative hemocult.  Will be admitted to stepdown unit for medical management of her STEMI.   ED ECG REPORT   Date: 12/17/2015  Rate: 104   Rhythm: normal sinus rhythm  QRS Axis: normal  Intervals: normal  ST/T Wave abnormalities: nonspecific ST/T changes and ST elevations anteriorly  Conduction Disutrbances:right bundle branch block and left posterior fascicular block  Narrative Interpretation:   Old EKG Reviewed: changes noted  I have personally reviewed the EKG tracing and agree with the computerized printout as noted.  CRITICAL CARE Performed by: Ezequiel Essex Total critical care time: 33 minutes Critical care time was exclusive of separately billable procedures and treating other patients. Critical care was necessary to treat or prevent imminent or life-threatening deterioration. Critical care was time spent personally by me on the following activities: development of treatment plan with patient and/or surrogate as well as nursing, discussions with consultants, evaluation of patient's response to treatment, examination of patient, obtaining history from patient or surrogate, ordering and performing treatments and interventions, ordering and review of laboratory studies, ordering and review of radiographic studies, pulse oximetry and re-evaluation of patient's condition.    Final Clinical Impressions(s) / ED Diagnoses   Final diagnoses:  ST elevation myocardial infarction (STEMI), unspecified artery (HCC)    New Prescriptions New Prescriptions   No medications on file   I personally performed the services described in this documentation, which was scribed in my presence. The recorded information has been reviewed and is accurate.     Ezequiel Essex, MD 12/17/15 [phone]

## 2015-12-17 NOTE — Progress Notes (Signed)
ANTICOAGULATION CONSULT NOTE - Follow Up Consult  Pharmacy Consult for Heparin Indication: STEMI - medical management  Allergies  Allergen Reactions  . Actonel [Risedronate] Other (See Comments)    Per patients mar  . Boniva [Ibandronic Acid] Other (See Comments)    Per patiens mar  . Fosamax [Alendronate] Other (See Comments)    Per patiens mar  . Miacalcin [Calcitonin] Other (See Comments)    Per patients mar  . Sulfa Antibiotics Itching    Patient Measurements: Height: 4\' 11"  (149.9 cm) Weight: 122 lb 14.4 oz (55.7 kg) IBW/kg (Calculated) : 43.2 Heparin Dosing Weight: 54 kg  Vital Signs: BP: 135/79 (12/02 0345) Pulse Rate: 100 (12/02 0345)  Labs:  Recent Labs  12/17/15 0229 12/17/15 1045  HGB 7.4*  --   HCT 24.7*  --   PLT 516*  --   APTT 24  --   LABPROT 13.6  --   INR 1.03  --   HEPARINUNFRC  --  0.92*  CREATININE 1.11*  --   TROPONINI 0.21*  --     Estimated Creatinine Clearance: 26.1 mL/min (by C-G formula based on SCr of 1.11 mg/dL (H)).   Medications:  Scheduled:  . captopril  6.25 mg Oral TID  . clopidogrel  300 mg Oral Once  . metoprolol tartrate  25 mg Oral BID  .  morphine injection  2 mg Intravenous Once  . pantoprazole  40 mg Oral Daily   Infusions:  . sodium chloride 10 mL/hr (12/17/15 0233)  . heparin      Assessment: 82 yo F presents as code STEMI that was subsequently canceled.  Plan for medical mgmt.  Heparin level 0.92 on 650 units/hr.  No bleeding noted.  Baseline Hgb low at 7.4 - will watch closely.  Goal of Therapy:  Heparin level 0.3-0.7 units/ml Monitor platelets by anticoagulation protocol: Yes   Plan:  Hold heparin x 30 minutes - communicated to RN at noon. Restart heparin at 550 units/hr Repeat heparin level in 6 hours Monitor daily heparin levels and CBC. Add ASA?  Statin?  [employer], Pharm.D., BCPS Clinical Pharmacist 12/17/2015 12:03 PM

## 2015-12-17 NOTE — ED Notes (Signed)
Delay in lab draw,  Xray in room. 

## 2015-12-18 ENCOUNTER — Inpatient Hospital Stay (HOSPITAL_COMMUNITY): Payer: Medicare Other

## 2015-12-18 DIAGNOSIS — I2129 ST elevation (STEMI) myocardial infarction involving other sites: Secondary | ICD-10-CM

## 2015-12-18 DIAGNOSIS — D508 Other iron deficiency anemias: Secondary | ICD-10-CM

## 2015-12-18 DIAGNOSIS — Z515 Encounter for palliative care: Secondary | ICD-10-CM

## 2015-12-18 LAB — CBC
HEMATOCRIT: 26.8 % — AB (ref 36.0–46.0)
HEMOGLOBIN: 8.1 g/dL — AB (ref 12.0–15.0)
MCH: 22.6 pg — ABNORMAL LOW (ref 26.0–34.0)
MCHC: 30.2 g/dL (ref 30.0–36.0)
MCV: 74.7 fL — ABNORMAL LOW (ref 78.0–100.0)
Platelets: 533 10*3/uL — ABNORMAL HIGH (ref 150–400)
RBC: 3.59 MIL/uL — AB (ref 3.87–5.11)
RDW: 16.9 % — ABNORMAL HIGH (ref 11.5–15.5)
WBC: 14.7 10*3/uL — AB (ref 4.0–10.5)

## 2015-12-18 LAB — HEPARIN LEVEL (UNFRACTIONATED): HEPARIN UNFRACTIONATED: 0.42 [IU]/mL (ref 0.30–0.70)

## 2015-12-18 MED ORDER — ASPIRIN 81 MG PO CHEW
81.0000 mg | CHEWABLE_TABLET | Freq: Every day | ORAL | Status: DC
  Administered 2015-12-18 – 2015-12-20 (×2): 81 mg via ORAL
  Filled 2015-12-18 (×3): qty 1

## 2015-12-18 MED ORDER — ORAL CARE MOUTH RINSE
15.0000 mL | Freq: Two times a day (BID) | OROMUCOSAL | Status: DC
  Administered 2015-12-18 – 2015-12-20 (×5): 15 mL via OROMUCOSAL

## 2015-12-18 NOTE — Progress Notes (Addendum)
Patient Name: Tanya Leblanc Date of Encounter: 12/18/2015  Primary Cardiologist: None  Hospital Problem List     Active Problems:   STEMI (ST elevation myocardial infarction) ([place])     Subjective   Has dementia. Awake.   Inpatient Medications    Scheduled Meds: . aspirin  81 mg Oral Daily  . captopril  6.25 mg Oral TID  . clopidogrel  300 mg Oral Once  . furosemide  40 mg Intravenous Daily  . mouth rinse  15 mL Mouth Rinse BID  . metoprolol tartrate  25 mg Oral BID  .  morphine injection  2 mg Intravenous Once  . pantoprazole  40 mg Oral Daily  . potassium chloride  20 mEq Oral BID   Continuous Infusions: . sodium chloride 10 mL/hr (12/17/15 0233)   PRN Meds: guaiFENesin, ondansetron (ZOFRAN) IV   Vital Signs    Vitals:   12/17/15 1419 12/17/15 2139 12/17/15 2302 12/18/15 0541  BP:  120/81 115/74 103/65  Pulse: (!) 103 (!) 105 (!) 106 91  Resp:  (!) 21  (!) 23  Temp: 97.5 F (36.4 C) 98.6 F (37 C)  97.9 F (36.6 C)  TempSrc: Oral Oral  Axillary  SpO2:  95%  95%  Weight:    122 lb (55.3 kg)  Height:        Intake/Output Summary (Last 24 hours) at 12/18/15 0738 Last data filed at 12/18/15 0700  Gross per 24 hour  Intake             93.5 ml  Output                0 ml  Net             93.5 ml   Filed Weights   12/17/15 0200 12/17/15 0430 12/18/15 0541  Weight: 121 lb 11.1 oz (55.2 kg) 122 lb 14.4 oz (55.7 kg) 122 lb (55.3 kg)    Physical Exam    GEN: Frail, elderly, in no acute distress.  HEENT: Grossly normal.  Neck: Supple, no JVD or masses. Cardiac: RRR, no murmurs, rubs, or gallops. No clubbing, cyanosis, edema.   Respiratory:  Respirations regular and unlabored, clear to auscultation bilaterally. GI: Soft, nontender, nondistended MS: no deformity or atrophy. Skin: warm and dry, no rash.  Labs    CBC  Recent Labs  12/17/15 0229  WBC 8.5  NEUTROABS 7.0  HGB 7.4*  HCT 24.7*  MCV 74.2*  PLT 99991111*   Basic Metabolic  Panel  Recent Labs  12/17/15 0229  NA 137  K 4.1  CL 106  CO2 20*  GLUCOSE 210*  BUN 22*  CREATININE 1.11*  CALCIUM 9.0   Liver Function Tests  Recent Labs  12/17/15 0229  AST 24  ALT 14  ALKPHOS 54  BILITOT 0.2*  PROT 6.2*  ALBUMIN 3.5   No results for input(s): LIPASE, AMYLASE in the last 72 hours. Cardiac Enzymes  Recent Labs  12/17/15 0229  TROPONINI 0.21*   BNP Invalid input(s): POCBNP D-Dimer No results for input(s): DDIMER in the last 72 hours. Hemoglobin A1C No results for input(s): HGBA1C in the last 72 hours. Fasting Lipid Panel  Recent Labs  12/17/15 0229  CHOL 188  HDL 46  LDLCALC 126*  TRIG 82  CHOLHDL 4.1   Thyroid Function Tests No results for input(s): TSH, T4TOTAL, T3FREE, THYROIDAB in the last 72 hours.  Invalid input(s): FREET3  Telemetry    NSR - Personally Reviewed  ECG    - Personally Reviewed  Radiology    Dg Chest Port 1 View  Result Date: 12/17/2015 CLINICAL DATA:  Acute onset of generalized chest pain. Initial encounter. EXAM: PORTABLE CHEST 1 VIEW COMPARISON:  Chest radiograph performed 01/19/2014 FINDINGS: Vascular congestion is noted. Left basilar and bilateral perihilar airspace opacification may reflect pneumonia or possibly pulmonary edema. A small left pleural effusion is suspected. No pneumothorax is seen. The cardiomediastinal silhouette is borderline enlarged. No acute osseous abnormalities are seen. Clips are noted overlying the left axilla. IMPRESSION: Vascular congestion and borderline cardiomegaly. Left basilar and bilateral perihilar airspace opacification may reflect pneumonia or possibly pulmonary edema. Suspect small left pleural effusion. Electronically Signed   By: Garald Balding M.D.   On: 12/17/2015 03:09    Cardiac Studies     Patient Profile     72yoF with advanced dementia and cancer, admitted with STEMI, DNR code status.  Assessment & Plan    1. STEMI - has been treated medically with  IV heparin. However, due to Hgb 7.4, I will stop today. - asa, plavix loaded. I will start ASA 81 mg daily only. - metoprolol 25 bid - given low BP, captopril not given last night or this morning. I will dc. Low dose lisinopril can be started tomorrow if BP will tolerate - statin - treat pain with IV morphine - zofran prn for nausea  2. Anemia: - Will stop IV heparin. Repeat CBC tomorrow. Unclear how chronic this is.  3. Palliative medicine consult  4. CHF: Will stop Lasix for now. Lungs are clear.  Signed, Kate Sable, MD  12/18/2015, 7:38 AM

## 2015-12-18 NOTE — Progress Notes (Signed)
Report received via DAna RN in patient's room using SBAR format, reviewed orders, VS, POC and patient's general condition, assumed care of patient.

## 2015-12-19 DIAGNOSIS — D649 Anemia, unspecified: Secondary | ICD-10-CM

## 2015-12-19 DIAGNOSIS — I213 ST elevation (STEMI) myocardial infarction of unspecified site: Secondary | ICD-10-CM

## 2015-12-19 DIAGNOSIS — F03C0 Unspecified dementia, severe, without behavioral disturbance, psychotic disturbance, mood disturbance, and anxiety: Secondary | ICD-10-CM

## 2015-12-19 DIAGNOSIS — I2102 ST elevation (STEMI) myocardial infarction involving left anterior descending coronary artery: Secondary | ICD-10-CM

## 2015-12-19 DIAGNOSIS — F0390 Unspecified dementia without behavioral disturbance: Secondary | ICD-10-CM

## 2015-12-19 DIAGNOSIS — I10 Essential (primary) hypertension: Secondary | ICD-10-CM

## 2015-12-19 DIAGNOSIS — E876 Hypokalemia: Secondary | ICD-10-CM

## 2015-12-19 DIAGNOSIS — K219 Gastro-esophageal reflux disease without esophagitis: Secondary | ICD-10-CM

## 2015-12-19 LAB — CBC
HCT: 32.6 % — ABNORMAL LOW (ref 36.0–46.0)
HEMATOCRIT: 25.4 % — AB (ref 36.0–46.0)
HEMOGLOBIN: 7.6 g/dL — AB (ref 12.0–15.0)
Hemoglobin: 10.2 g/dL — ABNORMAL LOW (ref 12.0–15.0)
MCH: 22.4 pg — AB (ref 26.0–34.0)
MCH: 23 pg — AB (ref 26.0–34.0)
MCHC: 29.9 g/dL — ABNORMAL LOW (ref 30.0–36.0)
MCHC: 31.3 g/dL (ref 30.0–36.0)
MCV: 74.7 fL — AB (ref 78.0–100.0)
MCV: 73.4 fL — ABNORMAL LOW (ref 78.0–100.0)
PLATELETS: 467 10*3/uL — AB (ref 150–400)
Platelets: 498 10*3/uL — ABNORMAL HIGH (ref 150–400)
RBC: 3.4 MIL/uL — AB (ref 3.87–5.11)
RBC: 4.44 MIL/uL (ref 3.87–5.11)
RDW: 16.6 % — ABNORMAL HIGH (ref 11.5–15.5)
RDW: 16.3 % — ABNORMAL HIGH (ref 11.5–15.5)
WBC: 14.4 10*3/uL — ABNORMAL HIGH (ref 4.0–10.5)
WBC: 14.8 10*3/uL — AB (ref 4.0–10.5)

## 2015-12-19 LAB — PREALBUMIN: Prealbumin: 19.8 mg/dL (ref 18–38)

## 2015-12-19 LAB — COMPREHENSIVE METABOLIC PANEL
ALK PHOS: 67 U/L (ref 38–126)
ALT: 54 U/L (ref 14–54)
ANION GAP: 9 (ref 5–15)
AST: 128 U/L — ABNORMAL HIGH (ref 15–41)
Albumin: 3.4 g/dL — ABNORMAL LOW (ref 3.5–5.0)
BILIRUBIN TOTAL: 0.2 mg/dL — AB (ref 0.3–1.2)
BUN: 49 mg/dL — ABNORMAL HIGH (ref 6–20)
CALCIUM: 10 mg/dL (ref 8.9–10.3)
CO2: 22 mmol/L (ref 22–32)
Chloride: 111 mmol/L (ref 101–111)
Creatinine, Ser: 1.63 mg/dL — ABNORMAL HIGH (ref 0.44–1.00)
GFR, EST AFRICAN AMERICAN: 31 mL/min — AB (ref >60)
GFR, EST NON AFRICAN AMERICAN: 27 mL/min — AB (ref >60)
Glucose, Bld: 201 mg/dL — ABNORMAL HIGH (ref 65–99)
Potassium: 5.6 mmol/L — ABNORMAL HIGH (ref 3.5–5.1)
Sodium: 142 mmol/L (ref 135–145)
TOTAL PROTEIN: 7 g/dL (ref 6.5–8.1)

## 2015-12-19 LAB — PREPARE RBC (CROSSMATCH)

## 2015-12-19 LAB — TROPONIN I: Troponin I: 29.18 ng/mL (ref <0.03)

## 2015-12-19 LAB — OCCULT BLOOD X 1 CARD TO LAB, STOOL: Fecal Occult Bld: NEGATIVE

## 2015-12-19 MED ORDER — MORPHINE SULFATE (PF) 2 MG/ML IV SOLN: 1.0000 mg | INTRAVENOUS | Status: DC | PRN

## 2015-12-19 MED ORDER — GLYCOPYRROLATE 0.2 MG/ML IJ SOLN: 0.2000 mg | INTRAMUSCULAR | Status: DC | PRN

## 2015-12-19 MED ORDER — LORAZEPAM 2 MG/ML IJ SOLN: 0.5000 mg | Freq: Four times a day (QID) | INTRAMUSCULAR | Status: DC | PRN

## 2015-12-19 NOTE — Progress Notes (Signed)
Elevated troponin noted.  Pt likely w/ ongoing STEMI EKG showing persistent STEMI in V1-V4. Blood transfusion likely to help but concern for ongoing STEMI resulting in pt eventual pt demise.  Family aware and multiple family members coming into town.  Pt likely to be made hospice in am (and possibly comfort care) after further discussions between the Palliative Care team and family.   Linna Darner, MD [facility] 12/19/2015, 6:36 PM

## 2015-12-19 NOTE — Progress Notes (Signed)
Consultation Note Date: 12/19/2015   Patient Name: Tanya Leblanc  DOB: November 18, 1924  MRN: [id number]  Age / Sex: 82 y.o., female  PCP: Gayland Curry, DO Referring Physician: Terressa Koyanagi, MD  Reason for Consultation: Establishing goals of care  HPI/Patient Profile: 82 y.o. female  with past medical history of worsening dementia, breast cancer, melanoma, CKD admitted on 12/17/2015 with chest pain and acute STEMI and again with acutely higher troponin today.   Clinical Assessment and Goals of Care: Long discussion today with Ms. Kahn daughter and Chauncey Reading, Alda Lea. Explained to Abigail Butts her mother's decline and severely elevated troponin indicating further heart damage and ischemia. We discussed that prognosis is extremely poor and she is approaching EOL. Abigail Butts has siblings in [place] and will contact and have them come ASAP at my advice.   Abigail Butts was able to tell me about her mother's many accomplishments and volunteer projects. Also able to tell me that her mother has had much decline over past 3 years of dementia and that she has lost her mother prior to this hospitalization d/t dementia. Therapeutic listening and emotional support provided. Goal of care at this time is comfort with supportive care (blood transfusion) with hopes that family may arrive at bedside. Likely full comfort and hospice when family arrives (although she may pass before and Abigail Butts knows this is possible).    Primary Decision Maker HCPOA daughter Alda Lea [phone]    SUMMARY OF RECOMMENDATIONS   - Awaiting family to arrive from [place] is main goal but continue supportive efforts while awaiting family - Do not escalate care - Likely for hospice care unless she declines overnight   Code Status/Advance Care Planning:  DNR   Symptom Management:   PRNs placed for pain, dyspnea, agitation,  secretions at EOL.   Palliative Prophylaxis:   Aspiration, Bowel Regimen, Delirium Protocol and Frequent Pain Assessment  Additional Recommendations (Limitations, Scope, Preferences):  Full Comfort Care  Psycho-social/Spiritual:   Desire for further Chaplaincy support:yes  Additional Recommendations: Caregiving  Support/Resources, Education on Hospice and Grief/Bereavement Support  Prognosis:   Hours - Days  Discharge Planning: Likely eligible for hospice facility but may have hospital death.       Primary Diagnoses: Present on Admission: . STEMI (ST elevation myocardial infarction) ([place]) . Hyperlipidemia . Anemia . Alzheimer's disease . GERD (gastroesophageal reflux disease)   I have reviewed the medical record, interviewed the patient and family, and examined the patient. The following aspects are pertinent.  Past Medical History:  Diagnosis Date  . Amnesia   . Breast CA ([place])   . Cancer ([place])   . Chronic kidney disease   . Dementia   . GERD (gastroesophageal reflux disease)   . HOH (hard of hearing)   . Low back pain   . Melanoma ([place])   . Osteoporosis   . Scoliosis    Social History   Social History  . Marital status: Married    Spouse name: N/A  . Number of children: N/A  .  Years of education: N/A   Occupational History  . retired Agricultural engineer    Social History Main Topics  . Smoking status: Never Smoker  . Smokeless tobacco: Never Used  . Alcohol use Yes     Comment: 0.5 Glass a day  . Drug use: No  . Sexual activity: Not Asked   Other Topics Concern  . None   Social History Narrative   Lives at [employer] -skill unit   Never smoked   Exercise -yoga twice a week   POA/DNR/Living Will   Alcohol one glass of red wine daily         Family History  Problem Relation Age of Onset  . Myasthenia gravis Father    Scheduled Meds: . aspirin  81 mg Oral Daily  . clopidogrel  300 mg Oral Once  . mouth rinse  15 mL Mouth  Rinse BID  . metoprolol tartrate  25 mg Oral BID  .  morphine injection  2 mg Intravenous Once  . pantoprazole  40 mg Oral Daily   Continuous Infusions: . sodium chloride Stopped (12/18/15 0730)   PRN Meds:.guaiFENesin, ondansetron (ZOFRAN) IV Allergies  Allergen Reactions  . Actonel [Risedronate] Other (See Comments)    Per patients mar  . Boniva [Ibandronic Acid] Other (See Comments)    Per patiens mar  . Fosamax [Alendronate] Other (See Comments)    Per patiens mar  . Miacalcin [Calcitonin] Other (See Comments)    Per patients mar  . Sulfa Antibiotics Itching   Review of Systems  Unable to perform ROS: Acuity of condition    Physical Exam  Constitutional: She appears well-developed. She appears lethargic.  Pale  Cardiovascular: Tachycardia present.   Pulmonary/Chest: Effort normal. No accessory muscle usage. No tachypnea. No respiratory distress.  Abdominal: Soft. Normal appearance.  Neurological: She appears lethargic. She is disoriented.  Nursing note and vitals reviewed.   Vital Signs: BP 107/64   Pulse 100   Temp 98.4 F (36.9 C) (Oral)   Resp 17   Ht 4\' 11"  (1.499 m)   Wt 53.3 kg (117 lb 9.6 oz)   SpO2 92%   BMI 23.75 kg/m  Pain Assessment: No/denies pain   Pain Score: 0-No pain   SpO2: SpO2: 92 % O2 Device:SpO2: 92 % O2 Flow Rate: .O2 Flow Rate (L/min): 3 L/min  IO: Intake/output summary:  Intake/Output Summary (Last 24 hours) at 12/19/15 1403 Last data filed at 12/19/15 0840  Gross per 24 hour  Intake              320 ml  Output                0 ml  Net              320 ml    LBM: Last BM Date: 12/19/15 Baseline Weight: Weight: 55.2 kg (121 lb 11.1 oz) Most recent weight: Weight: 53.3 kg (117 lb 9.6 oz)     Palliative Assessment/Data: PPS: 10%     Time In/Out: [id number], [id number] Time Total: 73min Greater than 50%  of this time was spent counseling and coordinating care related to the above assessment and plan.  Signed  by: Vinie Sill, NP Palliative Medicine Team Pager # [phone] (M-F 8a-5p) Team Phone # [phone] (Nights/Weekends)

## 2015-12-19 NOTE — Progress Notes (Signed)
Patient Name: MANVI MANNIS Date of Encounter: 12/19/2015  Primary Cardiologist: [facility] Problem List     Active Problems:   STEMI (ST elevation myocardial infarction) [facility])     Subjective   Has dementia. Awake. But does not respond Tech helping her eat breakfast   Inpatient Medications    Scheduled Meds: . aspirin  81 mg Oral Daily  . clopidogrel  300 mg Oral Once  . mouth rinse  15 mL Mouth Rinse BID  . metoprolol tartrate  25 mg Oral BID  .  morphine injection  2 mg Intravenous Once  . pantoprazole  40 mg Oral Daily  . potassium chloride  20 mEq Oral BID   Continuous Infusions: . sodium chloride Stopped (12/18/15 0730)   PRN Meds: guaiFENesin, ondansetron (ZOFRAN) IV   Vital Signs    Vitals:   12/18/15 1548 12/18/15 2117 12/18/15 2143 12/19/15 0527  BP: (!) 101/57 105/66 112/64 101/65  Pulse: 82 (!) 109 (!) 105 100  Resp: (!) 25  (!) 25 (!) 25  Temp: 98.1 F (36.7 C)  97.7 F (36.5 C) 97.5 F (36.4 C)  TempSrc: Oral  Axillary Axillary  SpO2: 98%  98% 92%  Weight:    53.3 kg (117 lb 9.6 oz)  Height:        Intake/Output Summary (Last 24 hours) at 12/19/15 0802 Last data filed at 12/18/15 2200  Gross per 24 hour  Intake              420 ml  Output                0 ml  Net              420 ml   Filed Weights   12/17/15 0430 12/18/15 0541 12/19/15 0527  Weight: 55.7 kg (122 lb 14.4 oz) 55.3 kg (122 lb) 53.3 kg (117 lb 9.6 oz)    Physical Exam    GEN: Frail, elderly, in no acute distress.  HEENT: Grossly normal.  Neck: Supple, no JVD or masses. Cardiac: RRR, no murmurs, rubs, or gallops. No clubbing, cyanosis, edema.   Respiratory:  Respirations regular and unlabored, clear to auscultation bilaterally. GI: Soft, nontender, nondistended MS: no deformity or atrophy. Skin: warm and dry, no rash.  Labs    CBC  Recent Labs  12/17/15 0229 12/18/15 0405 12/19/15 0410  WBC 8.5 14.7* 14.4*  NEUTROABS 7.0  --   --   HGB  7.4* 8.1* 7.6*  HCT 24.7* 26.8* 25.4*  MCV 74.2* 74.7* 74.7*  PLT 516* 533* 123XX123*   Basic Metabolic Panel  Recent Labs  12/17/15 0229  NA 137  K 4.1  CL 106  CO2 20*  GLUCOSE 210*  BUN 22*  CREATININE 1.11*  CALCIUM 9.0   Liver Function Tests  Recent Labs  12/17/15 0229  AST 24  ALT 14  ALKPHOS 54  BILITOT 0.2*  PROT 6.2*  ALBUMIN 3.5   Cardiac Enzymes  Recent Labs  12/17/15 0229  TROPONINI 0.21*   Fasting Lipid Panel  Recent Labs  12/17/15 0229  CHOL 188  HDL 46  LDLCALC 126*  TRIG 82  CHOLHDL 4.1   Thyroid Function Tests No results for input(s): TSH, T4TOTAL, T3FREE, THYROIDAB in the last 72 hours.  Invalid input(s): FREET3  Telemetry    NSR - Personally Reviewed RBBB 12/19/2015   ECG    - SR RBBB anterolateral MI persistent ST elevation   Radiology  Dg Chest Port 1 View  Result Date: 12/18/2015 CLINICAL DATA:  Chest congestion.  History of breast cancer. EXAM: PORTABLE CHEST 1 VIEW COMPARISON:  Yesterday. FINDINGS: The cardiac silhouette remains mildly enlarged. The pulmonary vasculature and interstitial markings remain prominent. The small left pleural effusion and probable small right pleural effusion. Left axillary surgical clips. Bilateral breast implants. Probable hiatal hernia. Mild left shoulder degenerative changes. IMPRESSION: Stable mild cardiomegaly and mild changes of congestive heart failure. Electronically Signed   By: Claudie Revering M.D.   On: 12/18/2015 09:12    Cardiac Studies     Patient Profile     82yoF with advanced dementia and cancer, admitted with STEMI, DNR code status.  Assessment & Plan    1. STEMI - has been treated medically with IV heparin. However, due to Hgb 7.6, stopped - asa, plavix loaded. I will start ASA 81 mg daily only. - metoprolol 25 bid - no ACE due to low BP  - statin - treat pain with IV morphine - zofran prn for nausea  2. Anemia: - Hct low but stable off heparin   3. Palliative  medicine consult  4. CHF: Will stop Lasix for now. Lungs are clear.  Transfer to medical / palliative care service as cardiology will not Rx patient aggressively Due to cancer and advanced dementia   Signed, Jenkins Rouge, MD  12/19/2015, 8:02 AM   Patient ID: Estanislado Pandy, female   DOB: [birth year], 82 y.o.   MRN: [id number]

## 2015-12-19 NOTE — Consult Note (Signed)
Medical Consultation   Tanya Leblanc  [id number]  DOB: [birth year]  DOA: 12/17/2015  PCP: Hollace Kinnier, DO    Requesting physician: Dr. Martinique  Reason for consultation: To assume care of patient     History of Present Illness: Tanya Leblanc is an 82 y.o. female with a history of severe dementia (MMSE 4/30) , Breast Cancer, Melanoma, GERD, CKD presenting to the ED from the nursing home due to severe chest pain as per chart report. History is unable to be provided by patient and is obtained by chart Per HandP, the symptoms started the midnight of 12/2 prior to presentation at  2 am. She received ASA and 2 NTG  On presentation with some relief of her pain. EKG at the time showed new LBBB with LAFB consistent with anterior STEMI. Cardiology admitted patient for further management. She was treated medically with IV heparin , BB, ACE I , and was admitted to SDU . [facility] were consulted for evaluation and possible assumption of care   Review of Systems:  Unable to obtain due to patient's advanced dementia     Past Medical History: Past Medical History:  Diagnosis Date  . Amnesia   . Breast CA ([place])   . Cancer ([place])   . Chronic kidney disease   . Dementia   . GERD (gastroesophageal reflux disease)   . HOH (hard of hearing)   . Low back pain   . Melanoma ([place])   . Osteoporosis   . Scoliosis     Past Surgical History: Past Surgical History:  Procedure Laterality Date  . BREAST SURGERY     reconstructive  . CATARACT EXTRACTION Bilateral 2011   Dr. Kathrin Penner  . MASTECTOMY Left 1990  . MELANOMA EXCISION     back, fore arm      Allergies:   Allergies  Allergen Reactions  . Actonel [Risedronate] Other (See Comments)    Per patients mar  . Boniva [Ibandronic Acid] Other (See Comments)    Per patiens mar  . Fosamax [Alendronate] Other (See Comments)    Per patiens mar  . Miacalcin [Calcitonin] Other (See Comments)    Per  patients mar  . Sulfa Antibiotics Itching     Social History: Social History   Social History  . Marital status: Married    Spouse name: Tanya Leblanc  . Number of children: Tanya Leblanc  . Years of education: Tanya Leblanc   Occupational History  . retired Agricultural engineer    Social History Main Topics  . Smoking status: Never Smoker  . Smokeless tobacco: Never Used  . Alcohol use Yes     Comment: 0.5 Glass a day  . Drug use: No  . Sexual activity: Not on file   Other Topics Concern  . Not on file   Social History Narrative   Lives at [employer] -skill unit   Never smoked   Exercise -yoga twice a week   POA/DNR/Living Will   Alcohol one glass of red wine daily             Family History: Family History  Problem Relation Age of Onset  . Myasthenia gravis Father     Family history reviewed and not pertinent    Physical Exam: Vitals:   12/18/15 1548 12/18/15 2117 12/18/15 2143 12/19/15 0527  BP: (!) 101/57 105/66 112/64 101/65  Pulse: 82 (!) 109 (!) 105 100  Resp: Marland Kitchen)  25  (!) 25 (!) 25  Temp: 98.1 F (36.7 C)  97.7 F (36.5 C) 97.5 F (36.4 C)  TempSrc: Oral  Axillary Axillary  SpO2: 98%  98% 92%  Weight:    53.3 kg (117 lb 9.6 oz)  Height:        Constitutional: Appears calm,awake but unable to interact due to advanced dementia  Eyes: PERLA, EOMI, irises appear normal, anicteric sclera,  ENMT: r hard of hearing.   Lips appears normal, oropharynx mucosa, tongue  normal  Neck: neck appears normal, no masses, normal ROM, no thyromegaly, no JVD  CVS: Tachy  no murmur rubs or gallops, no LE edema, normal pedal pulses  Respiratory: clear to auscultation bilaterally, no wheezing, rales or rhonchi. Respiratory effort normal. No accessory muscle use.  Abdomen: soft, appears  nontender, nondistended, normal bowel sounds, no hepatosplenomegaly, no hernias  Musculoskeletal: no cyanosis, clubbing or edema noted bilaterally Joint/bones/muscle exam no contractures or  atrophy Neuro: unable to assees due to severe dementia, cannot follow commands.  Skin: no rashes or lesions or ulcers, no induration or nodules   Data reviewed:  I have personally reviewed following labs and imaging studies Labs:  CBC:  Recent Labs Lab 12/17/15 0229 12/18/15 0405 12/19/15 0410  WBC 8.5 14.7* 14.4*  NEUTROABS 7.0  --   --   HGB 7.4* 8.1* 7.6*  HCT 24.7* 26.8* 25.4*  MCV 74.2* 74.7* 74.7*  PLT 516* 533* 498*    Basic Metabolic Panel:  Recent Labs Lab 12/17/15 0229  NA 137  K 4.1  CL 106  CO2 20*  GLUCOSE 210*  BUN 22*  CREATININE 1.11*  CALCIUM 9.0   GFR Estimated Creatinine Clearance: 25.6 mL/min (by C-G formula based on SCr of 1.11 mg/dL (H)). Liver Function Tests:  Recent Labs Lab 12/17/15 0229  AST 24  ALT 14  ALKPHOS 54  BILITOT 0.2*  PROT 6.2*  ALBUMIN 3.5   No results for input(s): LIPASE, AMYLASE in the last 168 hours. No results for input(s): AMMONIA in the last 168 hours. Coagulation profile  Recent Labs Lab 12/17/15 0229  INR 1.03    Cardiac Enzymes:  Recent Labs Lab 12/17/15 0229  TROPONINI 0.21*   BNP: Invalid input(s): POCBNP CBG: No results for input(s): GLUCAP in the last 168 hours. D-Dimer No results for input(s): DDIMER in the last 72 hours. Hgb A1c No results for input(s): HGBA1C in the last 72 hours. Lipid Profile  Recent Labs  12/17/15 0229  CHOL 188  HDL 46  LDLCALC 126*  TRIG 82  CHOLHDL 4.1   Thyroid function studies No results for input(s): TSH, T4TOTAL, T3FREE, THYROIDAB in the last 72 hours.  Invalid input(s): FREET3 Anemia work up No results for input(s): VITAMINB12, FOLATE, FERRITIN, TIBC, IRON, RETICCTPCT in the last 72 hours. Urinalysis    Component Value Date/Time   COLORURINE YELLOW 01/20/2014 0308   APPEARANCEUR CLOUDY (A) 01/20/2014 0308   LABSPEC 1.019 01/20/2014 0308   PHURINE 6.5 01/20/2014 0308   GLUCOSEU 250 (A) 01/20/2014 0308   HGBUR MODERATE (A) 01/20/2014 0308    BILIRUBINUR NEGATIVE 01/20/2014 0308   KETONESUR NEGATIVE 01/20/2014 0308   PROTEINUR 100 (A) 01/20/2014 0308   UROBILINOGEN 1.0 01/20/2014 0308   NITRITE NEGATIVE 01/20/2014 0308   LEUKOCYTESUR SMALL (A) 01/20/2014 0308     Sepsis Labs Invalid input(s): PROCALCITONIN,  WBC,  LACTICIDVEN Microbiology No results found for this or any previous visit (from the past 240 hour(s)).     Inpatient Medications:  Scheduled Meds: . aspirin  81 mg Oral Daily  . clopidogrel  300 mg Oral Once  . mouth rinse  15 mL Mouth Rinse BID  . metoprolol tartrate  25 mg Oral BID  .  morphine injection  2 mg Intravenous Once  . pantoprazole  40 mg Oral Daily  . potassium chloride  20 mEq Oral BID   Continuous Infusions: . sodium chloride Stopped (12/18/15 0730)     Radiological Exams on Admission: Dg Chest Port 1 View  Result Date: 12/18/2015 CLINICAL DATA:  Chest congestion.  History of breast cancer. EXAM: PORTABLE CHEST 1 VIEW COMPARISON:  Yesterday. FINDINGS: The cardiac silhouette remains mildly enlarged. The pulmonary vasculature and interstitial markings remain prominent. The small left pleural effusion and probable small right pleural effusion. Left axillary surgical clips. Bilateral breast implants. Probable hiatal hernia. Mild left shoulder degenerative changes. IMPRESSION: Stable mild cardiomegaly and mild changes of congestive heart failure. Electronically Signed   By: Claudie Revering M.D.   On: 12/18/2015 09:12    Impression/Recommendations Active Problems:   Hyperlipidemia   Hard of hearing   Anemia   Alzheimer's disease   GERD (gastroesophageal reflux disease)   STEMI (ST elevation myocardial infarction) ([place])   STEMI: treated medically with IV heparin however due to low Hb 7.6, , heparin d/c', and loaded with ASA 91 mg and Plavix as per Cards noted . She was placed on BB, ACE I . CXR  With mild cardiomegaly of CHF but stable. Weight 53 kg  Initial Tn was 0.21 on 12/2.  Will  recheck her Tn today.  Will defer Cardiac issues to primary team   Anemia acute on chronic disease. Initial Hb was 7.4 , currently at  Hb 7.6 , BL 9.5-10 as of 05/2015  Check Hemoccult  Will transfuse 2 units of blood  Check CBC in am  Check PAB. To assess nutritional status   Chronic kidney disease stage 2   baseline creatinine 1   Current Cr 1.11, at baseline as of 12/2  Lab Results  Component Value Date   CREATININE 1.11 (H) 12/17/2015   CREATININE 0.9 06/03/2015   CREATININE 1.0 01/04/2015   IVF Repeat CMET I   GERD, no acute symptoms: Continue PPI  Severe Alzheimer's Dementia, score 4/30 Continue meds.   Goals of care: Palliative Care to consult for goals of care vs comfort care only in the setting of multiple comorbidities, including melanoma, breast cancer, severe dementia  Dr. Marily Memos to speak with family regarding these issues as well Will assume care of this patient on 12/4   Thank you for this consultation.  Our [facility] hospitalist team will assume care in am      [facility] E PA-C [facility] 12/19/2015, 9:19 AM

## 2015-12-19 NOTE — Clinical Social Work Note (Signed)
CSW has been informed that the patient is from [facility]. CSW will follow for discharge needs. CSW notes that palliative medicine has been consulted.   Liz Beach MSW, [place], [place], [id number]

## 2015-12-19 NOTE — Progress Notes (Signed)
Palliative:  Full note to follow. Long discussion today with Ms. Dimarco daughter and Chauncey Reading, Alda Lea. Explained to Abigail Butts her mother's decline and severely elevated troponin indicating further heart damage and ischemia. We discussed that prognosis is extremely poor and she is approaching EOL. Abigail Butts has siblings in [place] and will contact and have them come ASAP at my advice.   Abigail Butts was able to tell me about her mother's many accomplishments and volunteer projects. Also able to tell me that her mother has had much decline over past 3 years of dementia and that she has lost her mother prior to this hospitalization d/t dementia. Therapeutic listening and emotional support provided. Goal of care at this time is comfort with supportive care (blood transfusion) with hopes that family may arrive at bedside. Likely full comfort and hospice when family arrives (although she may pass before and Abigail Butts knows this is possible).   Vinie Sill, NP Palliative Medicine Team Pager # [phone] (M-F 8a-5p) Team Phone # [phone] (Nights/Weekends)

## 2015-12-19 NOTE — Progress Notes (Signed)
Updated report received in patient's room via [facility] RN using SBAR format, updated on VS, new orders and events of the day, assumed care of patient.

## 2015-12-20 DIAGNOSIS — F0280 Dementia in other diseases classified elsewhere without behavioral disturbance: Secondary | ICD-10-CM

## 2015-12-20 DIAGNOSIS — D649 Anemia, unspecified: Secondary | ICD-10-CM

## 2015-12-20 DIAGNOSIS — Z7189 Other specified counseling: Secondary | ICD-10-CM

## 2015-12-20 DIAGNOSIS — K219 Gastro-esophageal reflux disease without esophagitis: Secondary | ICD-10-CM

## 2015-12-20 DIAGNOSIS — F0390 Unspecified dementia without behavioral disturbance: Secondary | ICD-10-CM

## 2015-12-20 DIAGNOSIS — G301 Alzheimer's disease with late onset: Secondary | ICD-10-CM

## 2015-12-20 DIAGNOSIS — Z515 Encounter for palliative care: Secondary | ICD-10-CM

## 2015-12-20 DIAGNOSIS — I10 Essential (primary) hypertension: Secondary | ICD-10-CM

## 2015-12-20 DIAGNOSIS — I213 ST elevation (STEMI) myocardial infarction of unspecified site: Secondary | ICD-10-CM

## 2015-12-20 DIAGNOSIS — E785 Hyperlipidemia, unspecified: Secondary | ICD-10-CM

## 2015-12-20 LAB — TYPE AND SCREEN
ABO/RH(D): O POS
Antibody Screen: NEGATIVE
UNIT DIVISION: 0
Unit division: 0

## 2015-12-20 LAB — CBC
HEMATOCRIT: 32.8 % — AB (ref 36.0–46.0)
HEMOGLOBIN: 10.2 g/dL — AB (ref 12.0–15.0)
MCH: 22.8 pg — AB (ref 26.0–34.0)
MCHC: 31.1 g/dL (ref 30.0–36.0)
MCV: 73.4 fL — AB (ref 78.0–100.0)
Platelets: 460 10*3/uL — ABNORMAL HIGH (ref 150–400)
RBC: 4.47 MIL/uL (ref 3.87–5.11)
RDW: 16.8 % — ABNORMAL HIGH (ref 11.5–15.5)
WBC: 16 10*3/uL — ABNORMAL HIGH (ref 4.0–10.5)

## 2015-12-20 LAB — TROPONIN I: Troponin I: 24.67 ng/mL (ref <0.03)

## 2015-12-20 MED ORDER — MORPHINE SULFATE (CONCENTRATE) 10 MG/0.5ML PO SOLN: 5.0000 mg | ORAL | Status: DC | PRN

## 2015-12-20 MED ORDER — MORPHINE SULFATE (CONCENTRATE) 10 MG/0.5ML PO SOLN: 5.0000 mg | ORAL | 0 refills | Status: AC | PRN

## 2015-12-20 MED ORDER — ACETAMINOPHEN 500 MG PO TABS
500.0000 mg | ORAL_TABLET | Freq: Three times a day (TID) | ORAL | Status: DC
  Administered 2015-12-20: 500 mg via ORAL
  Filled 2015-12-20: qty 1

## 2015-12-20 MED ORDER — METOPROLOL TARTRATE 25 MG PO TABS: 25.0000 mg | ORAL_TABLET | Freq: Two times a day (BID) | ORAL | 0 refills | Status: AC

## 2015-12-20 NOTE — NC FL2 (Signed)
[place] MEDICAID FL2 LEVEL OF CARE SCREENING TOOL     IDENTIFICATION  Patient Name: Tanya Leblanc Birthdate: Nov 12, 1924 Sex: female Admission Date (Current Location): 12/17/2015  [facility] and [place] Number:  Herbalist and Address:  [facility] [place] [address] [place], [place] [address]      Provider Number: [id number]  Attending Physician Name and Address:  Cristal Ford, DO  Relative Name and Phone Number:       Current Level of Care: Hospital Recommended Level of Care: [place] Prior Approval Number:    Date Approved/Denied:   PASRR Number:    Discharge Plan: SNF    Current Diagnoses: Patient Active Problem List   Diagnosis Date Noted  . Goals of care, counseling/discussion   . Palliative care encounter   . Advanced dementia   . ST elevation myocardial infarction (STEMI) ([place])   . Essential hypertension   . Hypokalemia   . Gastroesophageal reflux disease without esophagitis   . Symptomatic anemia   . STEMI (ST elevation myocardial infarction) ([place]) 12/17/2015  . Falls frequently 10/13/2015  . GERD (gastroesophageal reflux disease) 09/11/2015  . Tremor 09/11/2015  . Hiatal hernia 06/02/2015  . Alzheimer's disease 02/03/2015  . Constipation 09/23/2014  . Scoliosis (and kyphoscoliosis), idiopathic 07/23/2014  . Anemia 05/27/2014  . Senile osteoporosis 02/23/2014  . Hard of hearing 02/23/2014  . Hyperlipidemia 02/22/2014  . TIA (transient ischemic attack) 01/19/2014    Orientation RESPIRATION BLADDER Height & Weight     Self  Normal Incontinent Weight: 53.8 kg (118 lb 8 oz) Height:  4\' 11"  (149.9 cm)  BEHAVIORAL SYMPTOMS/MOOD NEUROLOGICAL BOWEL NUTRITION STATUS   (NONE)  (NONE) Incontinent Diet (Heart Healthy)  AMBULATORY STATUS COMMUNICATION OF NEEDS Skin   Extensive Assist Verbally Normal                       Personal Care Assistance Level of Assistance  Bathing, Feeding, Dressing  Bathing Assistance: Maximum assistance Feeding assistance: Limited assistance Dressing Assistance: Maximum assistance     Functional Limitations Info  Sight, Hearing, Speech Sight Info: Adequate Hearing Info: Adequate Speech Info: Adequate    SPECIAL CARE FACTORS FREQUENCY   (Hospice services to follow patient at SNF)                    Contractures      Additional Factors Info  Code Status, Allergies Code Status Info: dnr Allergies Info: Actonel Risedronate, Boniva Ibandronic Acid, Fosamax Alendronate, Miacalcin Calcitonin, Sulfa Antibiotics           Current Medications (12/20/2015):  This is the current hospital active medication list Current Facility-Administered Medications  Medication Dose Route Frequency Provider Last Rate Last Dose  . 0.9 %  sodium chloride infusion  10-20 mL/hr Intravenous Continuous Ezequiel Essex, MD   Stopped at 12/18/15 0730  . acetaminophen (TYLENOL) tablet 500 mg  [id number] mg Oral TID Pershing Proud, NP      . guaiFENesin (ROBITUSSIN) 100 MG/5ML solution 100 mg  5 mL Oral Q4H PRN Lendon Colonel, NP      . MEDLINE mouth rinse  15 mL Mouth Rinse BID Terressa Koyanagi, MD   15 mL at 12/20/15 1038  . metoprolol tartrate (LOPRESSOR) tablet 25 mg  25 mg Oral BID Terressa Koyanagi, MD   25 mg at 12/20/15 1038  . morphine CONCENTRATE 10 MG/0.5ML oral solution 5 mg  5 mg Oral [id number] PRN Elmo Putt  Zack Seal, NP      . ondansetron [facility]) injection 4 mg  4 mg Intravenous Q6H PRN Terressa Koyanagi, MD      . pantoprazole (PROTONIX) EC tablet 40 mg  40 mg Oral Daily Terressa Koyanagi, MD   40 mg at 12/20/15 1038     Discharge Medications: Please see discharge summary for a list of discharge medications.  Relevant Imaging Results:  Relevant Lab Results:   Additional Information    Rigoberto Noel, LCSW

## 2015-12-20 NOTE — Progress Notes (Signed)
RN attempted report 4 times to [facility]. RN spoke with RN Raiford Noble twice who directed her to the right part of the facility that the pt was going to. Pt called that number 3 times with no answer. Will continue to monitor pt. Hoover Brunette, RN

## 2015-12-20 NOTE — Clinical Social Work Note (Signed)
Clinical Social Worker facilitated patient discharge including contacting patient family and facility to confirm patient discharge plans.  Clinical information faxed to facility and family agreeable with plan.  CSW arranged ambulance transport via [facility] to [facility] .  RN to call [phone] for  report prior to discharge.  Clinical Social Worker will sign off for now as social work intervention is no longer needed. Please consult [place] again if new need arises.  [address], [place]

## 2015-12-20 NOTE — Consult Note (Signed)
            [facility] CM Primary Care Navigator  12/20/2015  Tanya Leblanc 04-10-1924 [id number]   Went to see patient at the bedside to identify possible discharge needs. Patient is awake, appears calm but not able to interact or respond appropriately due to advanced dementia. No family members in the room.  Patient's Primary Care provider is Dr. Royal Hawthorn with [facility]. Electronic medical record reveals that patient resides at [facility] skilled nursing facility. Palliative Care had been consulted.  Per MD note, prognosis is extremely poor and she is approaching end of life. Goal of care at this time is comfort with supportive care (blood transfusion) with hopes that family may arrive at bedside. Likely full comfort and hospice when family arrives (although she may pass before and daughter- Abigail Butts knows this is possible).   For additional questions please contact:  Edwena Felty A. Lovina Zuver, BSN, RN-BC [facility] PRIMARY CARE Navigator Cell: [phone]

## 2015-12-20 NOTE — Discharge Instructions (Signed)
Heart Attack A heart attack (myocardial infarction, MI) causes damage to the heart that cannot be fixed. A heart attack often happens when a blood clot or other blockage cuts blood flow to the heart. When this happens, certain areas of the heart begin to die. This causes the pain you feel during a heart attack. Follow these instructions at home:  Take medicine as told by your doctor. You may need medicine to: ? Keep your blood from clotting too easily. ? Control your blood pressure. ? Lower your cholesterol. ? Control abnormal heart rhythms.  Change certain behaviors as told by your doctor. This may include: ? Quitting smoking. ? Being active. ? Eating a heart-healthy diet. Ask your doctor for help with this diet. ? Keeping a healthy weight. ? Keeping your diabetes under control. ? Lessening stress. ? Limiting how much alcohol you drink. Do not take these medicines unless your doctor says that you can:  Nonsteroidal anti-inflammatory drugs (NSAIDs). These include: ? Ibuprofen. ? Naproxen. ? Celecoxib.  Vitamin supplements that have vitamin A, vitamin E, or both.  Hormone therapy that contains estrogen with or without progestin.  Get help right away if:  You have sudden chest discomfort.  You have sudden discomfort in your: ? Arms. ? Back. ? Neck. ? Jaw.  You have shortness of breath at any time.  You have sudden sweating or clammy skin.  You feel sick to your stomach (nauseous) or throw up (vomit).  You suddenly get light-headed or dizzy.  You feel your heart beating fast or skipping beats. These symptoms may be an emergency. Do not wait to see if the symptoms will go away. Get medical help right away. Call your local emergency services (911 in the [place] Do not drive yourself to the hospital. This information is not intended to replace advice given to you by your health care provider. Make sure you discuss any questions you have with your health care  provider. Document Released: 07/03/2011 Document Revised: 06/09/2015 Document Reviewed: 03/06/2013 Elsevier Interactive Patient Education  2017 Elsevier Inc.  

## 2015-12-20 NOTE — Clinical Social Work Note (Signed)
Per MD pt is from [place] and at this time family is requesting the pt be sent back to [facility]. MD reports pt is ready for d/c. CSW will contact ALF and set up PTAR.  [address] [place]

## 2015-12-20 NOTE — Discharge Summary (Signed)
Physician Discharge Summary  Tanya Leblanc [id number] DOB: February 28, 1924 DOA: 12/17/2015  PCP: Hollace Kinnier, DO  Admit date: 12/17/2015 Discharge date: 12/20/2015  Time spent: 45 minutes  Recommendations for Outpatient Follow-up:  Patient will be discharged to [facility] with hospice to follow.  Patient will need to follow up with primary care provider as needed. Continue medications for comfort. Continue comfort feeds.  Discharge Diagnoses:  STEMI Anemia secondary to chronic disease Acute kidney injury on chronic kidney disease, stage II GERD Chronic diastolic heart failure Severe Alzheimer's dementia  Discharge Condition: Stable  Diet recommendation: Comfort   Filed Weights   12/18/15 0541 12/19/15 0527 12/20/15 0444  Weight: 55.3 kg (122 lb) 53.3 kg (117 lb 9.6 oz) 53.8 kg (118 lb 8 oz)    History of present illness:  On 12/19/2015 by Ms. Sharene Butters, PA Tanya Leblanc is an 82 y.o. female with a history of severe dementia (MMSE 4/30) , Breast Cancer, Melanoma, GERD, CKD presenting to the ED from the nursing home due to severe chest pain as per chart report. History is unable to be provided by patient and is obtained by chart Per HandP, the symptoms started the midnight of 12/2 prior to presentation at  2 am. She received ASA and 2 NTG  On presentation with some relief of her pain. EKG at the time showed new LBBB with LAFB consistent with anterior STEMI. Cardiology admitted patient for further management. She was treated medically with IV heparin , BB, ACE I , and was admitted to SDU . [facility] were consulted for evaluation and possible assumption of care   Hospital Course:  STEMI -Patient initially admitted by cardiology service. It seems that patient's family opted for medical and conservative management. TR[facility] asked to assume care. -She was placed on IV heparin however patient's hemoglobin did fall to 7.6 -Patient was placed on aspirin and  Plavix -Patient transition to comfort care. Will minimize her medications. Will discontinue aspirin and Plavix. Will continue metoprolol for rate control and comfort.   Anemia acute on chronic disease  -Initial Hb was 7.4 , currently at  Hb 7.6 , BL 9.5-10 as of 05/2015  -FOBT negative -Patient was given blood transfusion, hemoglobin currently 10.2  AKI on Chronic kidney disease stage 2    -baseline creatinine 1    -Current Cr 1.63  GERD -Continue PPI for comfort  Chronic Diastolic heart failure -Echocardiogram 01/19/2014 showed an EF of 0000000, grade 1 diastolic dysfunction -Patient does not appear to be overloaded -Cardiology discontinued Lasix  Severe Alzheimer's Dementia  Goals of care -Palliative care consulted and appreciated -Patient currently DO NOT RESUSCITATE -Plan is for [facility] with hospice to follow. Plan for comfort care. Patient will be discharged with morphine to be used as needed for comfort. Her medications will be minimized.  Procedures:  None  Consultations:  Cardiology  Palliative care  Discharge Exam: Vitals:   12/20/15 0749 12/20/15 1038  BP: 130/90   Pulse: 100 99  Resp: (!) 27   Temp:       General: Well developed, well nourished, NAD  HEENT: NCAT, mucous membranes moist.  Cardiovascular: S1 S2 auscultated, Tachycardic  Respiratory: Clear to auscultation bilaterally with equal chest rise  Abdomen: Soft, nontender, nondistended, + bowel sounds  Extremities: warm dry without cyanosis clubbing or edema  Neuro: unable to assess due to dementia  Discharge Instructions Discharge Instructions    Discharge instructions    Complete by:  As directed    Patient will be discharged to [facility] with hospice to follow.  Patient will need to follow up with primary care provider as needed. Continue medications for comfort. Continue comfort feeds.     Current Discharge Medication List      START taking these medications   Details  metoprolol tartrate (LOPRESSOR) 25 MG tablet Take 1 tablet (25 mg total) by mouth 2 (two) times daily. Qty: 60 tablet, Refills: 0    Morphine Sulfate (MORPHINE CONCENTRATE) 10 MG/0.5ML SOLN concentrated solution Take 0.25 mLs (5 mg total) by mouth every 3 (three) hours as needed for severe pain or shortness of breath. Qty: 180 mL, Refills: 0      CONTINUE these medications which have NOT CHANGED   Details  acetaminophen (TYLENOL) 500 MG tablet Take 1,000 mg by mouth 2 (two) times daily. DO NOT EXCEED 3GM IN 24 HOURS    omeprazole (PRILOSEC) 20 MG capsule Take 20 mg by mouth 2 (two) times daily before a meal.    senna (SENOKOT) 176 MG/5ML SYRP Take 10 mLs by mouth 2 (two) times daily. Mix in chocolate milk      STOP taking these medications     Cholecalciferol (VITAMIN D) 2000 UNITS CAPS        Allergies  Allergen Reactions  . Actonel [Risedronate] Other (See Comments)    Per patients mar  . Boniva [Ibandronic Acid] Other (See Comments)    Per patiens mar  . Fosamax [Alendronate] Other (See Comments)    Per patiens mar  . Miacalcin [Calcitonin] Other (See Comments)    Per patients mar  . Sulfa Antibiotics Itching   Follow-up Information    REED, TIFFANY, DO Follow up.   Specialty:  Geriatric Medicine Why:  As needed Contact information: [address] [place] [address] [phone]            The results of significant diagnostics from this hospitalization (including imaging, microbiology, ancillary and laboratory) are listed below for reference.    Significant Diagnostic Studies: Dg Chest Port 1 View  Result Date: 12/18/2015 CLINICAL DATA:  Chest congestion.  History of breast cancer. EXAM: PORTABLE CHEST 1 VIEW COMPARISON:  Yesterday. FINDINGS: The cardiac silhouette remains mildly enlarged. The pulmonary vasculature and interstitial markings remain prominent. The small left pleural effusion and probable small  right pleural effusion. Left axillary surgical clips. Bilateral breast implants. Probable hiatal hernia. Mild left shoulder degenerative changes. IMPRESSION: Stable mild cardiomegaly and mild changes of congestive heart failure. Electronically Signed   By: Claudie Revering M.D.   On: 12/18/2015 09:12   Dg Chest Port 1 View  Result Date: 12/17/2015 CLINICAL DATA:  Acute onset of generalized chest pain. Initial encounter. EXAM: PORTABLE CHEST 1 VIEW COMPARISON:  Chest radiograph performed 01/19/2014 FINDINGS: Vascular congestion is noted. Left basilar and bilateral perihilar airspace opacification may reflect pneumonia or possibly pulmonary edema. A small left pleural effusion is suspected. No pneumothorax is seen. The cardiomediastinal silhouette is borderline enlarged. No acute osseous abnormalities are seen. Clips are noted overlying the left axilla. IMPRESSION: Vascular congestion and borderline cardiomegaly. Left basilar and bilateral perihilar airspace opacification may reflect pneumonia or possibly pulmonary edema. Suspect small left pleural effusion. Electronically Signed   By: Garald Balding M.D.   On: 12/17/2015 03:09    Microbiology: No results found for this or any previous visit (from the past 240 hour(s)).   Labs: Basic Metabolic Panel:  Recent Labs Lab 12/17/15 0229 12/19/15 1022  NA 137 142  K 4.1 5.6*  CL 106 111  CO2 20* 22  GLUCOSE 210* 201*  BUN 22* 49*  CREATININE 1.11* 1.63*  CALCIUM 9.0 10.0   Liver Function Tests:  Recent Labs Lab 12/17/15 0229 12/19/15 1022  AST 24 128*  ALT 14 54  ALKPHOS 54 67  BILITOT 0.2* 0.2*  PROT 6.2* 7.0  ALBUMIN 3.5 3.4*   No results for input(s): LIPASE, AMYLASE in the last 168 hours. No results for input(s): AMMONIA in the last 168 hours. CBC:  Recent Labs Lab 12/17/15 0229 12/18/15 0405 12/19/15 0410 12/19/15 2055 12/20/15 0343  WBC 8.5 14.7* 14.4* 14.8* 16.0*  NEUTROABS 7.0  --   --   --   --   HGB 7.4* 8.1* 7.6*  10.2* 10.2*  HCT 24.7* 26.8* 25.4* 32.6* 32.8*  MCV 74.2* 74.7* 74.7* 73.4* 73.4*  PLT 516* 533* 498* 467* 460*   Cardiac Enzymes:  Recent Labs Lab 12/17/15 0229 12/19/15 1022 12/20/15 0343  TROPONINI 0.21* 29.18* 24.67*   BNP: BNP (last 3 results) No results for input(s): BNP in the last 8760 hours.  ProBNP (last 3 results) No results for input(s): PROBNP in the last 8760 hours.  CBG: No results for input(s): GLUCAP in the last 168 hours.     SignedCristal Ford  [facility] 12/20/2015, 1:29 PM

## 2015-12-20 NOTE — Care Management Note (Signed)
Case Management Note  Patient Details  Name: ADALAY RUCKER MRN: [id number] Date of Birth: 01-14-1925  Subjective/Objective: Pt presented for chest pain. Palliative Care consulted. Pt is from [place] and the plan will be to return 12-20-15. CSW assisting with disposition needs.                    Action/Plan: No further needs from CM at this time.   Expected Discharge Date:                  Expected Discharge Plan:  [place]  In-House Referral:  Clinical Social Work  Discharge planning Services  NA  Post Acute Care Choice:  NA Choice offered to:  NA  DME Arranged:  N/A DME Agency:  NA  HH Arranged:  NA HH Agency:  NA  Status of Service:  Completed, signed off  If discussed at [place] of Stay Meetings, dates discussed:    Additional Comments:  Bethena Roys, RN 12/20/2015, 2:47 PM

## 2015-12-20 NOTE — Progress Notes (Signed)
Patient's daughter called and updated on patient's condition, patient received 2 units of PRBC's and her Hemoglobin now 10.2 from 7.6, last unit finished at 2000, VSS with no s/s of reaction noted but will continue to monitor.

## 2015-12-20 NOTE — Progress Notes (Signed)
Updated report received in patient's room via [facility] RN using [employer], reviewed VS, new orders and events of the day, assumed care of patient.

## 2015-12-21 ENCOUNTER — Telehealth: Payer: Self-pay

## 2015-12-21 NOTE — Telephone Encounter (Signed)
Possible re-admission to facility. This is a patient you were seeing at [facility].  [facility] F/U is needed if patient was re-admitted to facility upon discharge. Hospital discharge from [facility] on 12/20/15 with hospice/comfort care.

## 2015-12-21 NOTE — Telephone Encounter (Signed)
I did not see her yesterday b/c she was not yet there around 6pm when I was leaving.  Tanya Leblanc, can you do a transitions of care visit for her?  I will see her next week myself.

## 2015-12-22 ENCOUNTER — Encounter: Payer: Self-pay | Admitting: Adult Health

## 2015-12-22 ENCOUNTER — Non-Acute Institutional Stay (SKILLED_NURSING_FACILITY): Payer: Medicare Other | Admitting: Adult Health

## 2015-12-22 DIAGNOSIS — F0280 Dementia in other diseases classified elsewhere without behavioral disturbance: Secondary | ICD-10-CM | POA: Diagnosis not present

## 2015-12-22 DIAGNOSIS — Z7189 Other specified counseling: Secondary | ICD-10-CM

## 2015-12-22 DIAGNOSIS — D649 Anemia, unspecified: Secondary | ICD-10-CM | POA: Diagnosis not present

## 2015-12-22 DIAGNOSIS — K5901 Slow transit constipation: Secondary | ICD-10-CM | POA: Diagnosis not present

## 2015-12-22 DIAGNOSIS — M4120 Other idiopathic scoliosis, site unspecified: Secondary | ICD-10-CM

## 2015-12-22 DIAGNOSIS — I213 ST elevation (STEMI) myocardial infarction of unspecified site: Secondary | ICD-10-CM | POA: Diagnosis not present

## 2015-12-22 DIAGNOSIS — K449 Diaphragmatic hernia without obstruction or gangrene: Secondary | ICD-10-CM

## 2015-12-22 DIAGNOSIS — I219 Acute myocardial infarction, unspecified: Secondary | ICD-10-CM

## 2015-12-22 DIAGNOSIS — I10 Essential (primary) hypertension: Secondary | ICD-10-CM

## 2015-12-22 DIAGNOSIS — R251 Tremor, unspecified: Secondary | ICD-10-CM | POA: Diagnosis not present

## 2015-12-22 DIAGNOSIS — G301 Alzheimer's disease with late onset: Secondary | ICD-10-CM | POA: Diagnosis not present

## 2015-12-22 NOTE — Progress Notes (Signed)
Location:  Occupational psychologist of Service:  SNF (31) Provider:   [name], ANP [place] [phone]   REED, Jonelle Sidle, DO  Patient Care Team: Gayland Curry, DO as PCP - General (Geriatric Medicine)  Extended Emergency Contact Information Primary Emergency Contact: Eustace Pen [place] Phone: [phone] Mobile Phone: [phone] Relation: Daughter  Code Status:  DNR Goals of care: Advanced Directive information Advanced Directives 12/22/2015  Does Patient Have a Medical Advance Directive? Yes  Type of Paramedic of [place];Out of facility DNR (pink MOST or yellow form)  Does patient want to make changes to medical advance directive? -  Copy of [place] in Chart? -  Pre-existing out of facility DNR order (yellow form or pink MOST form) Yellow form placed in chart (order not valid for inpatient use)     Chief Complaint  Patient presents with  . Hospitalization Follow-up    HPI:  Pt is a 82 y.o. female seen today for a hospital f/u s/p admission from 12/17/15-12/20/15 due to chest pain with subsequent dx of anterior STEMI. The symptoms started the midnight of 12/2 prior to presentation at 2 am. She received ASA and 2 NTG On presentation with some relief of her pain. EKG at the time showed new LBBB with LAFB consistent with anterior STEMI. Cardiology admitted patient for further management. She was treated medically with IV heparin , BB, ACE I , and was admitted to SDU . Later her family decided to go with comfort care.  Most of her meds were discontinued but the BB was left on board to help with comfort and rate control.     Initial Hb was 7.4 , currently at Hb 7.6 , BL 9.5-10 as of 05/2015  FOBT negative Patient was given blood transfusion, hemoglobin currently 10.2.  Echocardiogram 01/19/2014 showed an EF of 79-03%, grade 1 diastolic dysfunction Patient does not  appear to be overloaded Cardiology discontinued Lasix  She was [facility] care. She is non ambulatory and very weak at this time. She has a poor appetite and sleeps much of the day.  She is refusing pills and food. Her two daughter are here and wish to keep her comfortable.  She has advanced dementia and underlying parkinsonian like symptoms.  She has not had any further CP, SOB, etc. She remains on oxygen for comfort but takes it off frequently.  Past Medical History:  Diagnosis Date  . Amnesia   . Breast CA ([place])   . Cancer ([place])   . Chronic kidney disease   . Dementia   . GERD (gastroesophageal reflux disease)   . HOH (hard of hearing)   . Low back pain   . Melanoma ([place])   . Osteoporosis   . Scoliosis    Past Surgical History:  Procedure Laterality Date  . BREAST SURGERY     reconstructive  . CATARACT EXTRACTION Bilateral 2011   Dr. Kathrin Penner  . MASTECTOMY Left 1990  . MELANOMA EXCISION     back, fore arm     Allergies  Allergen Reactions  . Actonel [Risedronate] Other (See Comments)    Per patients mar  . Boniva [Ibandronic Acid] Other (See Comments)    Per patiens mar  . Fosamax [Alendronate] Other (See Comments)    Per patiens mar  . Miacalcin [Calcitonin] Other (See Comments)    Per patients mar  . Sulfa Antibiotics Itching  Medication List       Accurate as of 12/22/15  3:20 PM. Always use your most recent med list.          acetaminophen 500 MG tablet Commonly known as:  TYLENOL Take 1,000 mg by mouth 2 (two) times daily. DO NOT EXCEED 3GM IN 24 HOURS   metoprolol tartrate 25 MG tablet Commonly known as:  LOPRESSOR Take 1 tablet (25 mg total) by mouth 2 (two) times daily.   morphine CONCENTRATE 10 MG/0.5ML Soln concentrated solution Take 0.25 mLs (5 mg total) by mouth every 3 (three) hours as needed for severe pain or shortness of breath.   nitroGLYCERIN 0.4 MG SL tablet Commonly known as:  NITROSTAT Place  0.4 mg under the tongue every 5 (five) minutes as needed for chest pain.   omeprazole 20 MG capsule Commonly known as:  PRILOSEC Take 20 mg by mouth 2 (two) times daily before a meal.   senna 176 MG/5ML Syrp Commonly known as:  SENOKOT Take 10 mLs by mouth 2 (two) times daily. Mix in chocolate milk       Review of Systems  Unable to perform ROS: Dementia    Immunization History  Administered Date(s) Administered  . Hepatitis A 11/21/2005  . Influenza Inj Mdck Quad Pf 11/03/2015  . Influenza, High Dose Seasonal PF 10/06/2013  . Influenza-Unspecified 10/28/2014  . Pneumococcal Conjugate-13 10/06/2013  . Pneumococcal Polysaccharide-23 12/13/1998  . Td 11/22/2004  . Typhoid Inactivated 07/02/2011  . Zoster 07/02/2011   Pertinent  Health Maintenance Due  Topic Date Due  . INFLUENZA VACCINE  Completed  . PNA vac Low Risk Adult  Completed   Fall Risk  11/19/2015 12/30/2014 05/27/2014  Falls in the past year? Yes Yes Yes  Number falls in past yr: 2 or more 2 or more 2 or more  Injury with Fall? No No No  Risk Factor Category  High Fall Risk High Fall Risk High Fall Risk  Risk for fall due to : History of fall(s);Impaired balance/gait History of fall(s);Impaired balance/gait History of fall(s)  Follow up Falls evaluation completed Falls evaluation completed Falls evaluation completed   Functional Status Survey:    Vitals:   12/22/15 1509  BP: (!) 152/95  Pulse: 88  Resp: 20  Temp: 97.1 F (36.2 C)  SpO2: 94%  Weight: 119 lb (54 kg)   Body mass index is 24.04 kg/m. Physical Exam  Constitutional: No distress.  HENT:  Head: Normocephalic and atraumatic.  Right Ear: External ear normal.  Left Ear: External ear normal.  Nose: Nose normal.  Mouth/Throat: Oropharynx is clear and moist. No oropharyngeal exudate.  Eyes: Conjunctivae are normal. Pupils are equal, round, and reactive to light. Right eye exhibits no discharge. Left eye exhibits no discharge.  Neck: No JVD  present.  Cardiovascular: Normal rate and regular rhythm.   No murmur heard. No edema  Pulmonary/Chest: Effort normal and breath sounds normal. No respiratory distress. She has no wheezes.  Abdominal: Soft. Bowel sounds are normal. She exhibits no distension.  Neurological: She is alert.  Non verbal, no able to follow commands. Looks at me and smiles and tracks me in the room.  No tremor noted on exam  Skin: Skin is warm and dry. She is not diaphoretic.  Psychiatric: She has a normal mood and affect.  Nursing note and vitals reviewed.   Labs reviewed:  Recent Labs  06/03/15 12/17/15 0229 12/19/15 1022  NA 141 137 142  K 4.5 4.1 5.6*  CL  --  106 111  CO2  --  20* 22  GLUCOSE  --  210* 201*  BUN 18 22* 49*  CREATININE 0.9 1.11* 1.63*  CALCIUM  --  9.0 10.0    Recent Labs  12/17/15 0229 12/19/15 1022  AST 24 128*  ALT 14 54  ALKPHOS 54 67  BILITOT 0.2* 0.2*  PROT 6.2* 7.0  ALBUMIN 3.5 3.4*    Recent Labs  12/17/15 0229  12/19/15 0410 12/19/15 2055 12/20/15 0343  WBC 8.5  < > 14.4* 14.8* 16.0*  NEUTROABS 7.0  --   --   --   --   HGB 7.4*  < > 7.6* 10.2* 10.2*  HCT 24.7*  < > 25.4* 32.6* 32.8*  MCV 74.2*  < > 74.7* 73.4* 73.4*  PLT 516*  < > 498* 467* 460*  < > = values in this interval not displayed. No results found for: TSH Lab Results  Component Value Date   HGBA1C 5.8 (H) 01/19/2014   Lab Results  Component Value Date   CHOL 188 12/17/2015   HDL 46 12/17/2015   LDLCALC 126 (H) 12/17/2015   TRIG 82 12/17/2015   CHOLHDL 4.1 12/17/2015    Significant Diagnostic Results in last 30 days:  Dg Chest Port 1 View  Result Date: 12/18/2015 CLINICAL DATA:  Chest congestion.  History of breast cancer. EXAM: PORTABLE CHEST 1 VIEW COMPARISON:  Yesterday. FINDINGS: The cardiac silhouette remains mildly enlarged. The pulmonary vasculature and interstitial markings remain prominent. The small left pleural effusion and probable small right pleural effusion. Left  axillary surgical clips. Bilateral breast implants. Probable hiatal hernia. Mild left shoulder degenerative changes. IMPRESSION: Stable mild cardiomegaly and mild changes of congestive heart failure. Electronically Signed   By: Claudie Revering M.D.   On: 12/18/2015 09:12   Dg Chest Port 1 View  Result Date: 12/17/2015 CLINICAL DATA:  Acute onset of generalized chest pain. Initial encounter. EXAM: PORTABLE CHEST 1 VIEW COMPARISON:  Chest radiograph performed 01/19/2014 FINDINGS: Vascular congestion is noted. Left basilar and bilateral perihilar airspace opacification may reflect pneumonia or possibly pulmonary edema. A small left pleural effusion is suspected. No pneumothorax is seen. The cardiomediastinal silhouette is borderline enlarged. No acute osseous abnormalities are seen. Clips are noted overlying the left axilla. IMPRESSION: Vascular congestion and borderline cardiomegaly. Left basilar and bilateral perihilar airspace opacification may reflect pneumonia or possibly pulmonary edema. Suspect small left pleural effusion. Electronically Signed   By: Garald Balding M.D.   On: 12/17/2015 03:09    Assessment/Plan 1. ST elevation myocardial infarction (STEMI), unspecified artery (HCC) No further symptoms Continue lopressor 25 mg BID Nitro 0.4 mg SL q 5 min prn cp Continue roxanol as needed  2. Essential hypertension Elevated today, would not be aggressive given her goals of care and age  52. Hiatal hernia With Gerd Controlled Prilosec 20 mg BID  4. Slow transit constipation Controlled Continue senna 10 mg BID, may change to senna s a previously ordered of she has difficult with BM's  5. Late onset Alzheimer's disease without behavioral disturbance Severe, needs assistance for all ADL's and mostly non verbal  6. Scoliosis (and kyphoscoliosis), idiopathic Denies pain at this time, continue scheduled tylenol 1 gram BID  7. Tremor Not noted on exam today Has a hx of this with some rigidity  to BUE  8. Anemia, unspecified type Improved after transfusion Would not check further labs at this point  9. Goals of care, counseling/discussion I discussed with  her daughters a most form.  They will review and return to the staff.  They would like to avoid further hospitalizations unless her comfort needs can not be met her at [facility].  She is currently not eating and seems to be nearing the end of life.  Her family is aware of this and wish to keep her comfortable with no heroic measures.    Family/ staff Communication: discussed with her daughter Labs/tests ordered:  NA

## 2015-12-27 DIAGNOSIS — R609 Edema, unspecified: Secondary | ICD-10-CM | POA: Insufficient documentation

## 2015-12-27 DIAGNOSIS — R269 Unspecified abnormalities of gait and mobility: Secondary | ICD-10-CM | POA: Insufficient documentation

## 2016-01-16 DEATH — deceased

## 2018-05-18 IMAGING — CR DG CHEST 1V PORT
1 series · 1 of 1 positions shown · non-contrast
Comparison: Yesterday.

CLINICAL DATA: Chest congestion.  History of breast cancer.

EXAM:
PORTABLE CHEST 1 VIEW

[AP]
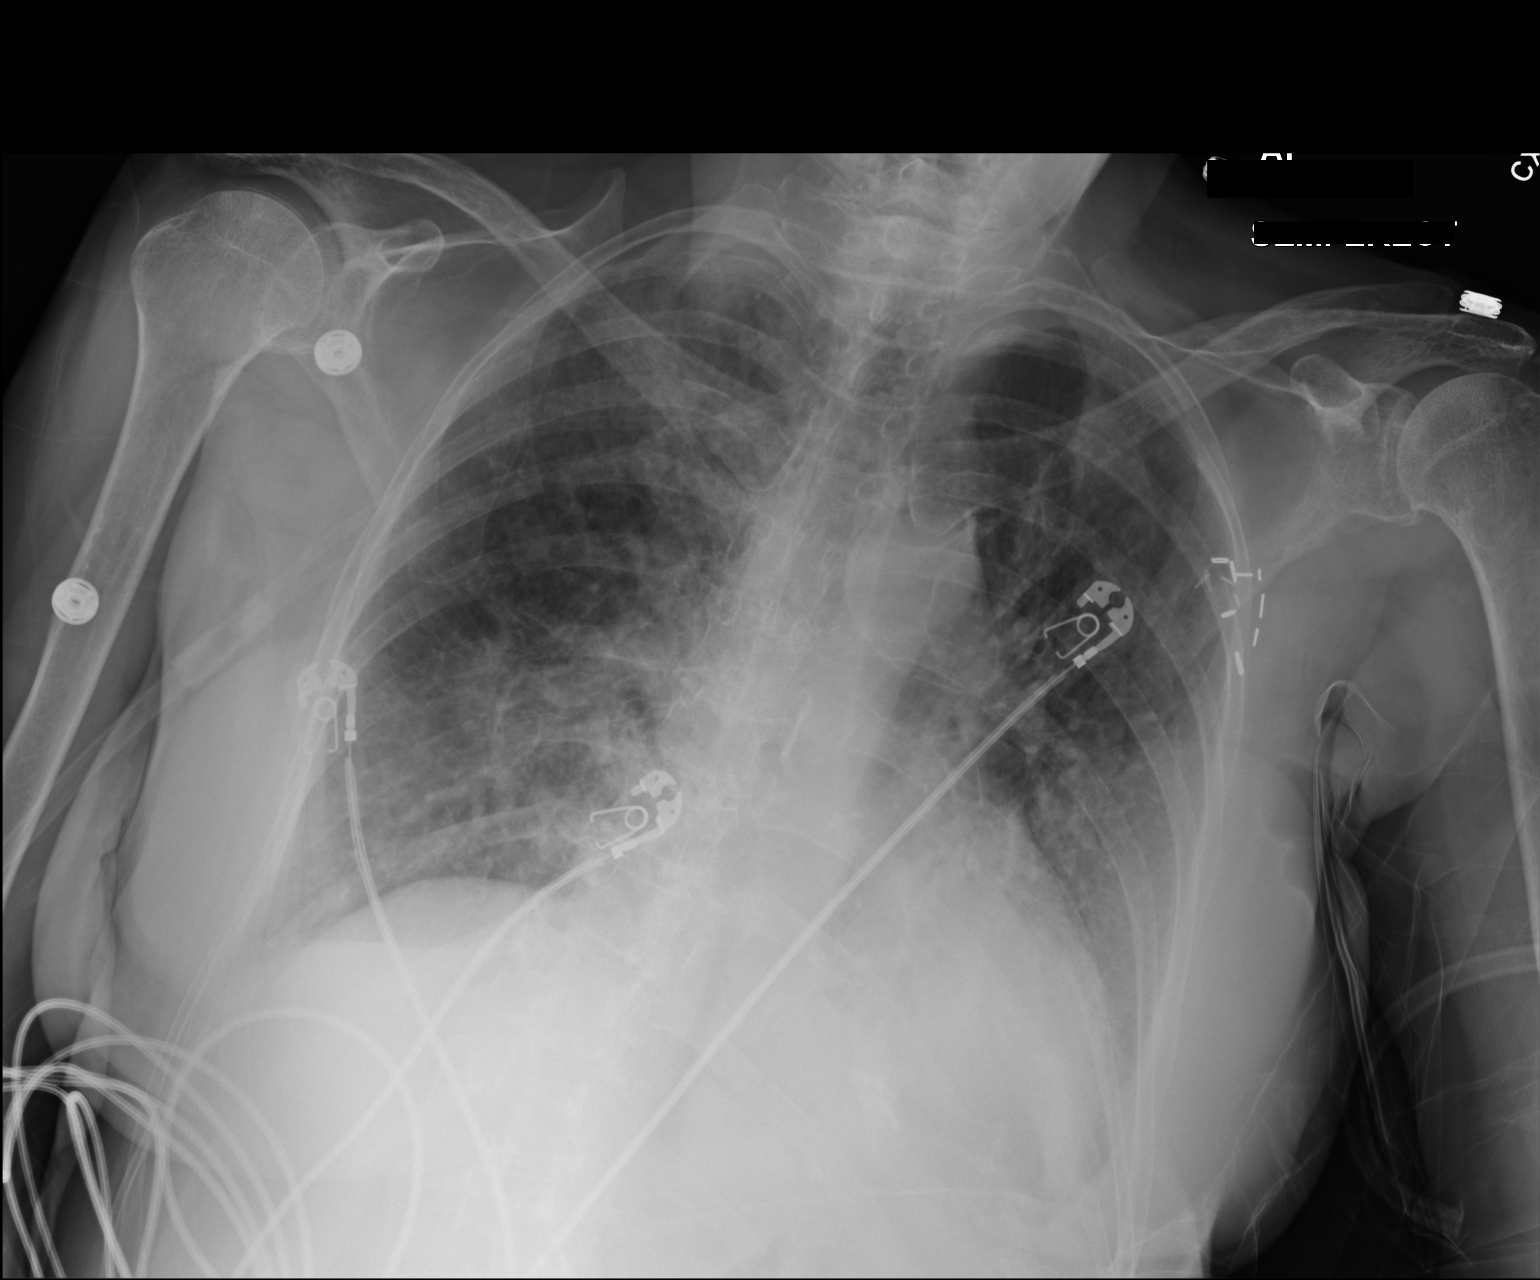

[1 of 1 positions shown; findings below may reference images not displayed]

FINDINGS: The cardiac silhouette remains mildly enlarged. The pulmonary
vasculature and interstitial markings remain prominent. The small
left pleural effusion and probable small right pleural effusion.
Left axillary surgical clips. Bilateral breast implants. Probable
hiatal hernia. Mild left shoulder degenerative changes.
IMPRESSION: Stable mild cardiomegaly and mild changes of congestive heart
failure.
